# Patient Record
Sex: Male | Born: 1960 | Race: White | Hispanic: No | Smoking: Current every day smoker
Health system: Southern US, Community
[De-identification: ages and names within clinical notes are randomized; demographics above are authoritative.]

## PROBLEM LIST (undated history)

## (undated) DIAGNOSIS — S43006A Unspecified dislocation of unspecified shoulder joint, initial encounter: Secondary | ICD-10-CM

## (undated) DIAGNOSIS — F419 Anxiety disorder, unspecified: Secondary | ICD-10-CM

## (undated) DIAGNOSIS — K469 Unspecified abdominal hernia without obstruction or gangrene: Secondary | ICD-10-CM

## (undated) DIAGNOSIS — S0291XA Unspecified fracture of skull, initial encounter for closed fracture: Secondary | ICD-10-CM

## (undated) HISTORY — PX: BACK SURGERY: SHX140

---

## 2018-04-18 ENCOUNTER — Emergency Department (HOSPITAL_COMMUNITY)
Admission: EM | Admit: 2018-04-18 | Discharge: 2018-04-19 | Disposition: A | Discharge status: Home / Self Care | Payer: Self-pay | Attending: Emergency Medicine | Admitting: Emergency Medicine

## 2018-04-18 ENCOUNTER — Emergency Department (HOSPITAL_COMMUNITY): Payer: Self-pay

## 2018-04-18 ENCOUNTER — Encounter (HOSPITAL_COMMUNITY): Payer: Self-pay | Admitting: Emergency Medicine

## 2018-04-18 DIAGNOSIS — K5792 Diverticulitis of intestine, part unspecified, without perforation or abscess without bleeding: Secondary | ICD-10-CM | POA: Insufficient documentation

## 2018-04-18 DIAGNOSIS — Z79899 Other long term (current) drug therapy: Secondary | ICD-10-CM | POA: Insufficient documentation

## 2018-04-18 DIAGNOSIS — F17200 Nicotine dependence, unspecified, uncomplicated: Secondary | ICD-10-CM | POA: Insufficient documentation

## 2018-04-18 HISTORY — DX: Unspecified abdominal hernia without obstruction or gangrene: K46.9

## 2018-04-18 HISTORY — DX: Unspecified fracture of skull, initial encounter for closed fracture: S02.91XA

## 2018-04-18 HISTORY — DX: Unspecified dislocation of unspecified shoulder joint, initial encounter: S43.006A

## 2018-04-18 HISTORY — DX: Anxiety disorder, unspecified: F41.9

## 2018-04-18 LAB — COMPREHENSIVE METABOLIC PANEL
ALT: 36 U/L (ref 0–44)
ANION GAP: 10 (ref 5–15)
AST: 21 U/L (ref 15–41)
Albumin: 3.7 g/dL (ref 3.5–5.0)
Alkaline Phosphatase: 62 U/L (ref 38–126)
BUN: 10 mg/dL (ref 6–20)
CHLORIDE: 106 mmol/L (ref 98–111)
CO2: 23 mmol/L (ref 22–32)
CREATININE: 1.08 mg/dL (ref 0.61–1.24)
Calcium: 9.4 mg/dL (ref 8.9–10.3)
Glucose, Bld: 87 mg/dL (ref 70–99)
POTASSIUM: 4.7 mmol/L (ref 3.5–5.1)
SODIUM: 139 mmol/L (ref 135–145)
Total Bilirubin: 0.8 mg/dL (ref 0.3–1.2)
Total Protein: 7.1 g/dL (ref 6.5–8.1)

## 2018-04-18 LAB — ABO/RH: ABO/RH(D): A POS

## 2018-04-18 LAB — URINALYSIS, ROUTINE W REFLEX MICROSCOPIC
BILIRUBIN URINE: NEGATIVE
Glucose, UA: NEGATIVE mg/dL
HGB URINE DIPSTICK: NEGATIVE
Ketones, ur: NEGATIVE mg/dL
Leukocytes, UA: NEGATIVE
NITRITE: NEGATIVE
PROTEIN: NEGATIVE mg/dL
Specific Gravity, Urine: 1.027 (ref 1.005–1.030)
pH: 5 (ref 5.0–8.0)

## 2018-04-18 LAB — CBC
HCT: 49.7 % (ref 39.0–52.0)
HEMOGLOBIN: 16.2 g/dL (ref 13.0–17.0)
MCH: 31.2 pg (ref 26.0–34.0)
MCHC: 32.6 g/dL (ref 30.0–36.0)
MCV: 95.8 fL (ref 78.0–100.0)
PLATELETS: 271 10*3/uL (ref 150–400)
RBC: 5.19 MIL/uL (ref 4.22–5.81)
RDW: 13.4 % (ref 11.5–15.5)
WBC: 12 10*3/uL — AB (ref 4.0–10.5)

## 2018-04-18 LAB — TYPE AND SCREEN
ABO/RH(D): A POS
Antibody Screen: NEGATIVE

## 2018-04-18 MED ORDER — OXYCODONE-ACETAMINOPHEN 5-325 MG PO TABS
1.0000 | ORAL_TABLET | Freq: Once | ORAL | Status: AC
  Administered 2018-04-18: 1 via ORAL
  Filled 2018-04-18: qty 1

## 2018-04-18 NOTE — ED Triage Notes (Signed)
Per EMS:  Pt presents to ED for assessment of lower groin pain x 6 years, with hx of bilateral groin hernias.  On Tuesday he felt a sudden sharp pain while driving down the road, and starting Wednesday pt began to note blood when wiping.  Pt also c/o pain when urinating.  Patient in and out of tears in triage.  Pt c/o nausea and "feeling drunk" from the pain when it is intense.  VSS

## 2018-04-18 NOTE — ED Provider Notes (Signed)
MSE was initiated and I personally evaluated the patient and placed orders (if any) at  8:06 PM on April 18, 2018.  The patient appears stable so that the remainder of the MSE may be completed by another provider.  Patient placed in Quick Look pathway, seen and evaluated   Chief Complaint: Groin pain  HPI:   Patient presents to ED for multiple complaints.  He complains of R > L sided groin pain.  Symptoms worsened 3 days ago while he was driving.  2 days ago, he began experiencing bright red blood with wiping after his bowel movements.  Also reports blood mixed in with bowel movements.  Also reports pain with urination.  He had been told that he had bilateral groin hernias for which he has never needed surgical evaluation for.  ROS: Groin pain  Physical Exam:   Gen: No distress  Neuro: Awake and Alert  Skin: Warm    Focused Exam: TTP and edema or the suprapubic area. Patient declines exam for inguinal hernias 2/2 pain. Given pain medication.   Initiation of care has begun. The patient has been counseled on the process, plan, and necessity for staying for the completion/evaluation, and the remainder of the medical screening examination    Dietrich PatesKhatri, Mariaguadalupe Fialkowski, PA-C 04/18/18 2016    Tegeler, Canary Brimhristopher J, MD 04/19/18 [phone]

## 2018-04-19 MED ORDER — TRAMADOL HCL 50 MG PO TABS: 50.0000 mg | ORAL_TABLET | Freq: Four times a day (QID) | ORAL | 0 refills | Status: DC | PRN

## 2018-04-19 MED ORDER — SODIUM CHLORIDE 0.9 % IV BOLUS
500.0000 mL | Freq: Once | INTRAVENOUS | Status: AC
  Administered 2018-04-19: 500 mL via INTRAVENOUS

## 2018-04-19 MED ORDER — METRONIDAZOLE IN NACL 5-0.79 MG/ML-% IV SOLN
500.0000 mg | Freq: Once | INTRAVENOUS | Status: AC
  Administered 2018-04-19: 500 mg via INTRAVENOUS
  Filled 2018-04-19: qty 100

## 2018-04-19 MED ORDER — MORPHINE SULFATE (PF) 4 MG/ML IV SOLN
4.0000 mg | Freq: Once | INTRAVENOUS | Status: AC
  Administered 2018-04-19: 4 mg via INTRAVENOUS
  Filled 2018-04-19: qty 1

## 2018-04-19 MED ORDER — METRONIDAZOLE 500 MG PO TABS: 500.0000 mg | ORAL_TABLET | Freq: Three times a day (TID) | ORAL | 0 refills | Status: DC

## 2018-04-19 MED ORDER — CIPROFLOXACIN HCL 500 MG PO TABS: 500.0000 mg | ORAL_TABLET | Freq: Two times a day (BID) | ORAL | 0 refills | Status: DC

## 2018-04-19 MED ORDER — CIPROFLOXACIN IN D5W 400 MG/200ML IV SOLN
400.0000 mg | Freq: Once | INTRAVENOUS | Status: AC
Start: 2018-04-19 — End: 2018-04-19
  Administered 2018-04-19: 400 mg via INTRAVENOUS
  Filled 2018-04-19: qty 200

## 2018-04-19 MED ORDER — ONDANSETRON HCL 4 MG/2ML IJ SOLN
4.0000 mg | Freq: Once | INTRAMUSCULAR | Status: AC
  Administered 2018-04-19: 4 mg via INTRAVENOUS
  Filled 2018-04-19: qty 2

## 2018-04-19 NOTE — ED Provider Notes (Signed)
[facility] EMERGENCY DEPARTMENT Provider Note   CSN: [id number] Arrival date & time: 04/18/18  1945     History   Chief Complaint Chief Complaint  Patient presents with  . Rectal Bleeding  . Dysuria    HPI Evan Barker is a 57 y.o. male.  Patient presents to the emergency department with abdominal pain.  Patient reports that he has a history of inguinal hernias that have been hurting for many years.  Usually he has pain on the right side.  For the last several days, however, he has had left sided abdominal pain down into his groin.  He has felt constipated, has used magnesium citrate.  He has had bowel movements but it has not relieved the pain and pressure.  He has been straining, trying to have a bowel movement.  He has noticed some blood on the tissue when he wipes.     Past Medical History:  Diagnosis Date  . Anxiety   . Fracture, skull (HCC)   . Hernia of abdominal cavity   . Shoulder dislocation     There are no active problems to display for this patient.   Past Surgical History:  Procedure Laterality Date  . BACK SURGERY          Home Medications    Prior to Admission medications   Medication Sig Start Date End Date Taking? Authorizing Provider  acetaminophen (TYLENOL) 500 MG tablet Take 500-1,000 mg by mouth every 6 (six) hours as needed for mild pain, moderate pain, fever or headache.   Yes [provider]  ibuprofen (ADVIL,MOTRIN) 200 MG tablet Take 200-600 mg by mouth every 6 (six) hours as needed for fever, headache, mild pain, moderate pain or cramping.   Yes [provider]  omeprazole (PRILOSEC) 20 MG capsule Take 20 mg by mouth at bedtime.   Yes [provider]  traZODone (DESYREL) 50 MG tablet Take 150 mg by mouth at bedtime.   Yes [provider]    Family History History reviewed. No pertinent family history.  Social History Social History   Tobacco Use  . Smoking status: Current  Every Day Smoker    Packs/day: 1.00  . Smokeless tobacco: Never Used  Substance Use Topics  . Alcohol use: Not Currently  . Drug use: Never     Allergies   Aspirin and Penicillins   Review of Systems Review of Systems  Gastrointestinal: Positive for abdominal pain, anal bleeding and constipation.  All other systems reviewed and are negative.    Physical Exam Updated Vital Signs BP (!) 155/108 (BP Location: Left Arm)   Pulse (!) 101   Temp 98 F (36.7 C) (Oral)   Resp 18   SpO2 96%   Physical Exam  Constitutional: He is oriented to person, place, and time. He appears well-developed and well-nourished. No distress.  HENT:  Head: Normocephalic and atraumatic.  Right Ear: Hearing normal.  Left Ear: Hearing normal.  Nose: Nose normal.  Mouth/Throat: Oropharynx is clear and moist and mucous membranes are normal.  Eyes: Pupils are equal, round, and reactive to light. Conjunctivae and EOM are normal.  Neck: Normal range of motion. Neck supple.  Cardiovascular: Regular rhythm, S1 normal and S2 normal. Exam reveals no gallop and no friction rub.  No murmur heard. Pulmonary/Chest: Effort normal and breath sounds normal. No respiratory distress. He exhibits no tenderness.  Abdominal: Soft. Normal appearance and bowel sounds are normal. There is no hepatosplenomegaly. There is tenderness in  the left lower quadrant. There is no rebound, no guarding, no tenderness at McBurney's point and negative Murphy's sign. No hernia.  Musculoskeletal: Normal range of motion.  Neurological: He is alert and oriented to person, place, and time. He has normal strength. No cranial nerve deficit or sensory deficit. Coordination normal. GCS eye subscore is 4. GCS verbal subscore is 5. GCS motor subscore is 6.  Skin: Skin is warm, dry and intact. No rash noted. No cyanosis.  Psychiatric: He has a normal mood and affect. His speech is normal and behavior is normal. Thought content normal.  Nursing note  and vitals reviewed.    ED Treatments / Results  Labs (all labs ordered are listed, but only abnormal results are displayed) Labs Reviewed  CBC - Abnormal; Notable for the following components:      Result Value   WBC 12.0 (*)    All other components within normal limits  COMPREHENSIVE METABOLIC PANEL  URINALYSIS, ROUTINE W REFLEX MICROSCOPIC  POC OCCULT BLOOD, ED  TYPE AND SCREEN  ABO/RH    EKG None  Radiology Ct Renal Stone Study  Result Date: 04/18/2018 CLINICAL DATA:  Abdominal pain for several days EXAM: CT ABDOMEN AND PELVIS WITHOUT CONTRAST TECHNIQUE: Multidetector CT imaging of the abdomen and pelvis was performed following the standard protocol without oral intravenous contrast. COMPARISON:  None. FINDINGS: Lower chest: Lung bases are clear. There are foci of coronary artery calcification. Hepatobiliary: No focal liver lesions are evident on this noncontrast enhanced study. Gallbladder wall is not appreciably thickened. There is no biliary duct dilatation. Pancreas: No pancreatic mass or inflammatory focus. Spleen: No splenic lesions are evident. Adrenals/Urinary Tract: Adrenals bilaterally appear normal. Kidneys bilaterally show no evident mass or hydronephrosis on either side. There is no appreciable renal or ureteral calculus on either side. Urinary bladder is midline with wall thickness within normal limits. Stomach/Bowel: There is wall thickening in the sigmoid colon with surrounding mesenteric stranding consistent with a degree of diverticulitis. No abscess or perforation evident in the sigmoid region. Elsewhere, there is no bowel wall or mesenteric thickening. No evident bowel obstruction. No free air or portal venous air. Vascular/Lymphatic: There is extensive aortic atherosclerosis. There is also atherosclerotic calcification in the common and internal iliac arteries. Mild calcification is noted in the common femoral arteries. No evident aneurysm. Major mesenteric arterial  vessels appear patent on this noncontrast enhanced study. There is no evident adenopathy in the abdomen or pelvis. Reproductive: There are multiple prostatic calculi. Prostate and seminal vesicles appear normal in size and contour. There is no evident pelvic mass. Other: Appendix appears unremarkable. There is no abscess or ascites in the abdomen or pelvis. There is a small ventral hernia containing only fat. There is fat in each inguinal ring. Musculoskeletal: The patient has had a previous hemilaminotomy on the right at L4-5. There are no blastic or lytic bone lesions. There is no intramuscular lesion. IMPRESSION: 1.  Sigmoid diverticulitis.  No abscess or perforation evident. 2. No renal or ureteral calculi. No hydronephrosis. There are multiple prostatic calculi evident. 3. No bowel obstruction. Appendix appears normal. No abscess in the abdomen or pelvis. 4. Aortoiliac atherosclerosis. There are foci of coronary artery calcification. 5. Small ventral hernia containing only fat. Fat in each inguinal ring. Aortic Atherosclerosis (ICD10-I70.0). Electronically Signed   By: Bretta Bang III M.D.   On: 04/18/2018 20:32    Procedures Procedures (including critical care time)  Medications Ordered in ED Medications  sodium chloride 0.9 % bolus  500 mL (has no administration in time range)  morphine 4 MG/ML injection 4 mg (has no administration in time range)  ondansetron (ZOFRAN) injection 4 mg (has no administration in time range)  ciprofloxacin (CIPRO) IVPB 400 mg (has no administration in time range)  metroNIDAZOLE (FLAGYL) IVPB 500 mg (has no administration in time range)  oxyCODONE-acetaminophen (PERCOCET/ROXICET) 5-325 MG per tablet 1 tablet (1 tablet Oral Given 04/18/18 1956)     Initial Impression / Assessment and Plan / ED Course  I have reviewed the triage vital signs and the nursing notes.  Pertinent labs & imaging results that were available during my care of the patient were reviewed  by me and considered in my medical decision making (see chart for details).     Patient presents to the emergency department for evaluation of left lower abdominal pain radiating to the groin.  He is concerned about the possibility of hernias.  He does have left lower quadrant tenderness.  Blood work was unremarkable, urine is clear, no concern for infection.  CT scan shows sigmoid diverticulitis, uncomplicated.  Patient treated with Cipro Flagyl and analgesia.  Will be discharged with oral course of antibiotics, follow-up with gastroenterology.  Final Clinical Impressions(s) / ED Diagnoses   Final diagnoses:  Diverticulitis    ED Discharge Orders    None       Pollina, Canary Brimhristopher J, MD 04/19/18 [phone]

## 2018-11-10 ENCOUNTER — Other Ambulatory Visit: Payer: Self-pay

## 2018-11-10 ENCOUNTER — Emergency Department (HOSPITAL_COMMUNITY): Payer: Self-pay

## 2018-11-10 ENCOUNTER — Encounter (HOSPITAL_COMMUNITY): Payer: Self-pay | Admitting: Emergency Medicine

## 2018-11-10 ENCOUNTER — Observation Stay (HOSPITAL_COMMUNITY)
Admission: EM | Admit: 2018-11-10 | Discharge: 2018-11-11 | Disposition: A | Discharge status: Home / Self Care | Payer: Self-pay | Attending: Oncology | Admitting: Oncology

## 2018-11-10 ENCOUNTER — Observation Stay (HOSPITAL_COMMUNITY): Payer: Self-pay

## 2018-11-10 DIAGNOSIS — I4510 Unspecified right bundle-branch block: Secondary | ICD-10-CM

## 2018-11-10 DIAGNOSIS — F17210 Nicotine dependence, cigarettes, uncomplicated: Secondary | ICD-10-CM | POA: Insufficient documentation

## 2018-11-10 DIAGNOSIS — R202 Paresthesia of skin: Secondary | ICD-10-CM

## 2018-11-10 DIAGNOSIS — Z880 Allergy status to penicillin: Secondary | ICD-10-CM | POA: Insufficient documentation

## 2018-11-10 DIAGNOSIS — R29810 Facial weakness: Secondary | ICD-10-CM | POA: Insufficient documentation

## 2018-11-10 DIAGNOSIS — F419 Anxiety disorder, unspecified: Secondary | ICD-10-CM | POA: Insufficient documentation

## 2018-11-10 DIAGNOSIS — K219 Gastro-esophageal reflux disease without esophagitis: Secondary | ICD-10-CM | POA: Insufficient documentation

## 2018-11-10 DIAGNOSIS — Z79899 Other long term (current) drug therapy: Secondary | ICD-10-CM | POA: Insufficient documentation

## 2018-11-10 DIAGNOSIS — R079 Chest pain, unspecified: Principal | ICD-10-CM | POA: Diagnosis present

## 2018-11-10 DIAGNOSIS — R0789 Other chest pain: Secondary | ICD-10-CM

## 2018-11-10 DIAGNOSIS — R7303 Prediabetes: Secondary | ICD-10-CM | POA: Insufficient documentation

## 2018-11-10 DIAGNOSIS — Z886 Allergy status to analgesic agent status: Secondary | ICD-10-CM | POA: Insufficient documentation

## 2018-11-10 DIAGNOSIS — Z791 Long term (current) use of non-steroidal anti-inflammatories (NSAID): Secondary | ICD-10-CM | POA: Insufficient documentation

## 2018-11-10 DIAGNOSIS — R072 Precordial pain: Secondary | ICD-10-CM

## 2018-11-10 DIAGNOSIS — R531 Weakness: Secondary | ICD-10-CM | POA: Insufficient documentation

## 2018-11-10 DIAGNOSIS — G4700 Insomnia, unspecified: Secondary | ICD-10-CM | POA: Insufficient documentation

## 2018-11-10 LAB — COMPREHENSIVE METABOLIC PANEL
ALT: 32 U/L (ref 0–44)
AST: 22 U/L (ref 15–41)
Albumin: 3.7 g/dL (ref 3.5–5.0)
Alkaline Phosphatase: 45 U/L (ref 38–126)
Anion gap: 7 (ref 5–15)
BUN: 14 mg/dL (ref 6–20)
CALCIUM: 9.3 mg/dL (ref 8.9–10.3)
CO2: 22 mmol/L (ref 22–32)
CREATININE: 1.05 mg/dL (ref 0.61–1.24)
Chloride: 109 mmol/L (ref 98–111)
GFR calc non Af Amer: >60 mL/min (ref >60)
Glucose, Bld: 111 mg/dL — ABNORMAL HIGH (ref 70–99)
Potassium: 4.1 mmol/L (ref 3.5–5.1)
Sodium: 138 mmol/L (ref 135–145)
Total Bilirubin: 0.7 mg/dL (ref 0.3–1.2)
Total Protein: 6.8 g/dL (ref 6.5–8.1)

## 2018-11-10 LAB — CBC WITH DIFFERENTIAL/PLATELET
Abs Immature Granulocytes: 0.03 10*3/uL (ref 0.00–0.07)
BASOS PCT: 1 %
Basophils Absolute: 0.1 10*3/uL (ref 0.0–0.1)
EOS ABS: 0.2 10*3/uL (ref 0.0–0.5)
Eosinophils Relative: 2 %
HCT: 45.6 % (ref 39.0–52.0)
Hemoglobin: 15.9 g/dL (ref 13.0–17.0)
Immature Granulocytes: 0 %
Lymphocytes Relative: 30 %
Lymphs Abs: 2.9 10*3/uL (ref 0.7–4.0)
MCH: 31.5 pg (ref 26.0–34.0)
MCHC: 34.9 g/dL (ref 30.0–36.0)
MCV: 90.3 fL (ref 80.0–100.0)
MONO ABS: 0.5 10*3/uL (ref 0.1–1.0)
MONOS PCT: 6 %
Neutro Abs: 6.1 10*3/uL (ref 1.7–7.7)
Neutrophils Relative %: 61 %
PLATELETS: 253 10*3/uL (ref 150–400)
RBC: 5.05 MIL/uL (ref 4.22–5.81)
RDW: 12.7 % (ref 11.5–15.5)
WBC: 9.8 10*3/uL (ref 4.0–10.5)
nRBC: 0 % (ref 0.0–0.2)

## 2018-11-10 LAB — CREATININE, SERUM
Creatinine, Ser: 1.08 mg/dL (ref 0.61–1.24)
GFR calc Af Amer: >60 mL/min (ref >60)
GFR calc non Af Amer: >60 mL/min (ref >60)

## 2018-11-10 LAB — CBC
HCT: 48.1 % (ref 39.0–52.0)
Hemoglobin: 16.3 g/dL (ref 13.0–17.0)
MCH: 30.8 pg (ref 26.0–34.0)
MCHC: 33.9 g/dL (ref 30.0–36.0)
MCV: 90.9 fL (ref 80.0–100.0)
Platelets: 174 10*3/uL (ref 150–400)
RBC: 5.29 MIL/uL (ref 4.22–5.81)
RDW: 12.5 % (ref 11.5–15.5)
WBC: 10.2 10*3/uL (ref 4.0–10.5)
nRBC: 0 % (ref 0.0–0.2)

## 2018-11-10 LAB — D-DIMER, QUANTITATIVE (NOT AT ARMC): D DIMER QUANT: 0.33 ug{FEU}/mL (ref 0.00–0.50)

## 2018-11-10 LAB — CBG MONITORING, ED: Glucose-Capillary: 124 mg/dL — ABNORMAL HIGH (ref 70–99)

## 2018-11-10 LAB — LIPID PANEL
CHOL/HDL RATIO: 6.7 ratio
Cholesterol: 228 mg/dL — ABNORMAL HIGH (ref 0–200)
HDL: 34 mg/dL — ABNORMAL LOW (ref >40)
LDL CALC: 121 mg/dL — AB (ref 0–99)
Triglycerides: 365 mg/dL — ABNORMAL HIGH (ref <150)
VLDL: 73 mg/dL — ABNORMAL HIGH (ref 0–40)

## 2018-11-10 LAB — HEMOGLOBIN A1C
Hgb A1c MFr Bld: 5.8 % — ABNORMAL HIGH (ref 4.8–5.6)
Mean Plasma Glucose: 119.76 mg/dL

## 2018-11-10 LAB — BRAIN NATRIURETIC PEPTIDE: B NATRIURETIC PEPTIDE 5: 22.7 pg/mL (ref 0.0–100.0)

## 2018-11-10 LAB — TROPONIN I: Troponin I: <0.03 ng/mL (ref <0.03)

## 2018-11-10 MED ORDER — NICOTINE 21 MG/24HR TD PT24
21.0000 mg | MEDICATED_PATCH | Freq: Every day | TRANSDERMAL | Status: DC
  Administered 2018-11-11 (×2): 21 mg via TRANSDERMAL
  Filled 2018-11-10 (×2): qty 1

## 2018-11-10 MED ORDER — SENNOSIDES-DOCUSATE SODIUM 8.6-50 MG PO TABS: 1.0000 | ORAL_TABLET | Freq: Every evening | ORAL | Status: DC | PRN

## 2018-11-10 MED ORDER — SODIUM CHLORIDE 0.9 % IV SOLN: 250.0000 mL | INTRAVENOUS | Status: DC | PRN

## 2018-11-10 MED ORDER — ONDANSETRON HCL 4 MG PO TABS: 4.0000 mg | ORAL_TABLET | Freq: Four times a day (QID) | ORAL | Status: DC | PRN

## 2018-11-10 MED ORDER — ONDANSETRON HCL 4 MG/2ML IJ SOLN: 4.0000 mg | Freq: Four times a day (QID) | INTRAMUSCULAR | Status: DC | PRN

## 2018-11-10 MED ORDER — NITROGLYCERIN 0.4 MG SL SUBL
0.4000 mg | SUBLINGUAL_TABLET | SUBLINGUAL | Status: DC | PRN
  Administered 2018-11-11 (×3): 0.4 mg via SUBLINGUAL
  Filled 2018-11-10 (×2): qty 1

## 2018-11-10 MED ORDER — TRAZODONE HCL 50 MG PO TABS
150.0000 mg | ORAL_TABLET | Freq: Every day | ORAL | Status: DC
  Administered 2018-11-11: 150 mg via ORAL
  Filled 2018-11-10: qty 3

## 2018-11-10 MED ORDER — SODIUM CHLORIDE 0.9% FLUSH: 3.0000 mL | INTRAVENOUS | Status: DC | PRN

## 2018-11-10 MED ORDER — HEPARIN SODIUM (PORCINE) 5000 UNIT/ML IJ SOLN
5000.0000 [IU] | Freq: Three times a day (TID) | INTRAMUSCULAR | Status: DC
  Administered 2018-11-11 (×3): 5000 [IU] via SUBCUTANEOUS
  Filled 2018-11-10 (×3): qty 1

## 2018-11-10 MED ORDER — ACETAMINOPHEN 325 MG PO TABS
650.0000 mg | ORAL_TABLET | Freq: Four times a day (QID) | ORAL | Status: DC | PRN
  Administered 2018-11-10 – 2018-11-11 (×2): 650 mg via ORAL
  Filled 2018-11-10 (×2): qty 2

## 2018-11-10 MED ORDER — SODIUM CHLORIDE 0.9% FLUSH
3.0000 mL | Freq: Two times a day (BID) | INTRAVENOUS | Status: DC
  Administered 2018-11-11: 3 mL via INTRAVENOUS

## 2018-11-10 MED ORDER — ACETAMINOPHEN 650 MG RE SUPP: 650.0000 mg | Freq: Four times a day (QID) | RECTAL | Status: DC | PRN

## 2018-11-10 MED ORDER — PANTOPRAZOLE SODIUM 40 MG PO TBEC
40.0000 mg | DELAYED_RELEASE_TABLET | Freq: Every day | ORAL | Status: DC
  Administered 2018-11-10 – 2018-11-11 (×2): 40 mg via ORAL
  Filled 2018-11-10 (×2): qty 1

## 2018-11-10 NOTE — H&P (Addendum)
Date: 11/10/2018               Patient Name:  Evan Barker MRN: [id number]  DOB: August 12, 1961 Age / Sex: 58 y.o., male   PCP: System, Pcp Not In         Medical Service: Internal Medicine Teaching Service         Attending Physician: Dr. Cyndie Chime, Genene Churn, MD    First Contact: Dr. Petra Kuba Pager: [phone]  Second Contact: Dr. Alinda Money Pager: [phone]       After Hours (After 5p/  First Contact Pager: [phone]  weekends / holidays): Second Contact Pager: [phone]   Chief Complaint: chest pain  History of Present Illness: 57yoM with no diagnosed PMH who presents with chest pain and sob. He endorses 3 episodes since December, all occurring at rest. He describes acute onset pain between his shoulder blades with associated diaphoresis, sob, substernal to left sided chest pain (pressure and stabbing), headache, arm numbness. Episodes last 30 minutes and self resolve. However, this episode was more severe and longer duration. He denies nausea, vomiting or abdominal pain. He reports a childhood allergy to aspirin (hives) and was not given any medications by EMS.   He last saw a doctor when he was admitted 8 months ago for diverticulitis and does not have a PCP.   ROS is also positive for polyuria and polydipsia and a few months of intermittent dysarthria and balance issues (denies room spinning or syncope) along with peripheral neuropathy. He also endorses orthopnea with productive cough when he lays flat as well as dyspnea on exertion.    Meds:  Pantoprazole qd Trazadone qhs Ibuprofen prn    Allergies: Allergies as of 11/10/2018 - Review Complete 11/10/2018  Allergen Reaction Noted  . Aspirin Rash 04/18/2018  . Penicillins Rash 04/18/2018   Past Medical History:  Diagnosis Date  . Anxiety   . Fracture, skull (HCC)   . Hernia of abdominal cavity   . Shoulder dislocation     Family History: Several uncles with heart attack before age 39. Daughter with some kind of heart murmur  (unsure what her formal diagnosis is)  Social History: Smokes 1-2 packs per day since age 65, occasional alcohol use. Denies current or previous regular illicit drug use. 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 Has a girlfriend, children, and a grandson born in Nov.   Review of Systems: A complete ROS was negative except as per HPI.   Physical Exam: Blood pressure (!) 133/94, pulse 82, temperature 98.7 F (37.1 C), temperature source Oral, resp. rate 16, SpO2 99 %. Gen: well appearing but anxious, NAD, well nourished  HENT: PERRL, EOMI, oropharynx clear without exudate  Cardiac: no JVD, RRR, no m/r/g, left chest wall is TTP Pulm: CTAB, breathing comfortably  Abd: soft, NT, ND, reducible nonpainful umbilical and inguinal hernias Ext: no LEE, calf swelling or tenderness. Bilateral feet are cool, right colder than left. DP pulses and strong and symmetric bilaterally. No ischemic skin changes  Neuro: A&O x 3. EOMI, PERRL. Slight right sided facial droop, uvula midline,  Facial sensation is symmetric. Sensation is decreased in right arm and right leg with decreased (4+/5) strength in right arm, right grip, and leg and absent right patellar reflex. Left patellar reflex is present. Difficulty with finger to nose testing bilaterally  Psych: anxious appearing,  intermittently tearful, cognition intact   Labs: CBC and CMP unremarkable  Trop < 0.03 D-dimer 0.33 BNP 22.7   EKG: personally reviewed my interpretation is NSR HR 89 with RBBB and LAFB, no priors for comparison. No q waves or t wave inversions or st changes  CXR: personally reviewed my interpretation is no acute cardiopulmonary disease. Normal cardiac silhouette.  Assessment & Plan by Problem: Active Problems:   Chest pain, rule out acute myocardial infarction  57yoM with no routine medical care, at least a  55 pack year smoking history presenting with typical anginal chest pain occurring at rest.   Chest Pain: Acute onset pain between his shoulder blades with associated diaphoresis, sob, substernal to left sided chest pain (pressure and stabbing), headache, arm numbness. Episodes previously lasting 30 minutes and self resolve but current chest pain remains, albeit slightly improved. Also endorses orthopnea and doe, does not appear volume overloaded on exam. Still concern for possible CHF.  Initial trop negative. EKG with RBBB and LAFB without prior for comparison.  - risk factor modification: a1c (endorses polyuria and polydipsia), lipid panel - trend troponin - echo  - telemetry  - repeat ekg in the morning - SL nitro prn for chest pain, will give one dose now  - k pad for chest wall tenderness - npo at mn for possible cardiac intervention if troponins are elevated   Right sided weakness: endorses intermittent ataxia due to balance issues, dysarthria and on exam has mildly decreased strength in the right with right facial droop and decreased sensation in the right leg with absent patellar reflex. Concerning for stroke but symptoms have been ongoing for several months. Also question anxiety confounding neuro findings.  - head CT  - risk factor modification as above   Insomnia: continue home trazodone qhs GERD: continue home pantoprazole Tobacco abuse: 1-2 packs per day, will order nicotine patch   DVT ppx: heparin  FEN/GI: replete lytes prn, no IVFs, npo @ [facility] for possible cardiac intervention Code: Full   Dispo: Admit patient to Observation with expected length of stay less than 2 midnights.  Signed: Ali Lowe, MD 11/10/2018, 6:59 PM  Pager: [phone]

## 2018-11-10 NOTE — ED Notes (Signed)
Ordered dinner tray.  

## 2018-11-10 NOTE — ED Provider Notes (Signed)
Emergency Department Provider Note   I have reviewed the triage vital signs and the nursing notes.   HISTORY  Chief Complaint No chief complaint on file.   HPI Evan Barker is a 58 y.o. male with PMH of tobacco use but no PMD presents to the ED with pain between the shoulder blades with SOB and chest pain. Symptoms began suddenly today. Patient has had two prior episodes of pain and symptoms like this since September but has not been to the hospital for this in the past. Today's symptoms are worse than prior. EMS was called by his girlfriend. He was not given ASA noting an allergy as a child but is unsure regarding the nature of the allergy. He also reports generalized weakness and HA. No fever or chills. During the chest pain episode he did have tingling in the bilateral hands but no numbness. No lower extremity symptoms.   Past Medical History:  Diagnosis Date  . Anxiety   . Fracture, skull (HCC)   . Hernia of abdominal cavity   . Shoulder dislocation     There are no active problems to display for this patient.   Past Surgical History:  Procedure Laterality Date  . BACK SURGERY     Allergies Aspirin and Penicillins  Family History  Problem Relation Age of Onset  . Heart disease Maternal Uncle   . Heart disease Paternal Uncle   . Heart murmur Daughter     Social History Social History   Tobacco Use  . Smoking status: Current Every Day Smoker    Packs/day: 1.00  . Smokeless tobacco: Never Used  Substance Use Topics  . Alcohol use: Not Currently  . Drug use: Never    Review of Systems  Constitutional: No fever/chills. Positive generalized weakness.  Eyes: No visual changes. ENT: No sore throat. Cardiovascular: Positive chest pain. Respiratory: Positive shortness of breath. Gastrointestinal: No abdominal pain.  No nausea, no vomiting.  No diarrhea.  No constipation. Genitourinary: Negative for dysuria. Musculoskeletal: Negative for back pain. Skin:  Negative for rash. Neurological: Negative for focal weakness or numbness. Positive HA. Transient tingling in the fingers bilaterally.   10-point ROS otherwise negative.  ____________________________________________   PHYSICAL EXAM:  VITAL SIGNS: ED Triage Vitals [11/10/18 1505]  Enc Vitals Group     BP (!) 136/98     Pulse Rate 91     Resp 16     Temp 98.7 F (37.1 C)     Temp Source Oral     SpO2 96 %   Constitutional: Alert and oriented. No acute distress.  Eyes: Conjunctivae are normal.  Head: Atraumatic. Nose: No congestion/rhinnorhea. Mouth/Throat: Mucous membranes are moist.  Neck: No stridor.  Cardiovascular: Normal rate, regular rhythm. Good peripheral circulation. Grossly normal heart sounds.   Respiratory: Normal respiratory effort.  No retractions. Lungs CTAB. Gastrointestinal: Soft and nontender. No distention.  Musculoskeletal: No lower extremity tenderness nor edema. No gross deformities of extremities. Neurologic:  Normal speech and language. No gross focal neurologic deficits are appreciated. Decreased strength bilaterally. Normal CN exam 2-12.  Skin:  Skin is warm, dry and intact. No rash noted. Psychiatric: Mood and affect are slightly bizarre and muted. Not responding to internal stimuli.   ____________________________________________   LABS (all labs ordered are listed, but only abnormal results are displayed)  Labs Reviewed  COMPREHENSIVE METABOLIC PANEL - Abnormal; Notable for the following components:      Result Value   Glucose, Bld 111 (*)  All other components within normal limits  CBG MONITORING, ED - Abnormal; Notable for the following components:   Glucose-Capillary 124 (*)    All other components within normal limits  TROPONIN I  BRAIN NATRIURETIC PEPTIDE  CBC WITH DIFFERENTIAL/PLATELET  D-DIMER, QUANTITATIVE (NOT AT [facility])   ____________________________________________  EKG   EKG Interpretation  Date/Time:  Monday November 10 2018 15:04:26 EST Ventricular Rate:  89 PR Interval:    QRS Duration: 136 QT Interval:  394 QTC Calculation: 480 R Axis:   -42 Text Interpretation:  Sinus rhythm RBBB and LAFB No prior ECG for comparison.  No STEMI Confirmed by Theda Belfastegeler, Chris ([id number]) on 11/10/2018 3:07:30 PM       ____________________________________________  RADIOLOGY  Dg Chest 2 View  Result Date: 11/10/2018 CLINICAL DATA:  Chest pain. EXAM: CHEST - 2 VIEW COMPARISON:  None. FINDINGS: The heart size and mediastinal contours are within normal limits. Both lungs are clear. The visualized skeletal structures are unremarkable. IMPRESSION: No active cardiopulmonary disease. Electronically Signed   By: Gerome Samavid  Williams III M.D   On: 11/10/2018 15:58    ____________________________________________   PROCEDURES  Procedure(s) performed:   Procedures  None ____________________________________________   INITIAL IMPRESSION / ASSESSMENT AND PLAN / ED COURSE  Pertinent labs & imaging results that were available during my care of the patient were reviewed by me and considered in my medical decision making (see chart for details).  Patient with pain between the shoulder blades, CP, and SOB with generalized weakness. Somewhat bizarre affect and tearful at times. No focal neuro deficits. Afebrile here. Patient with active smoking history and age which increase risk of ACS. Unknown additional history as the patient does not have a PCP. BP elevated here but unknown chronicity. Plan for labs including troponin, d-dimer, and reassess. Patient reports allergy to ASA so not given. Currently chest-pain free.   Labs and CXR reviewed. Patient moderate risk by HEART score.   Discussed patient's case with Internal Medicine to request admission. Patient and family (if present) updated with plan. Care transferred to Medicine service.  I reviewed all nursing notes, vitals, pertinent old records, EKGs, labs, imaging (as  available).  ____________________________________________  FINAL CLINICAL IMPRESSION(S) / ED DIAGNOSES  Final diagnoses:  Precordial chest pain    Note:  This document was prepared using Dragon voice recognition software and may include unintentional dictation errors.  Alona BeneJoshua Issabella Rix, MD Emergency Medicine    Alexxis Mackert, Arlyss RepressJoshua G, MD 11/10/18 [phone]

## 2018-11-10 NOTE — ED Notes (Signed)
Patient transported to X-ray 

## 2018-11-10 NOTE — ED Notes (Signed)
Pt given sandwich and water.

## 2018-11-10 NOTE — ED Triage Notes (Addendum)
Per EMS: pt from home with c/o CP that begins suddenly and radiates to both arms.  Pt notes 3 episodes since December 2019.  Pt is weak bilaterally in all extremities.  EMS reports pt had an episode of passing out, believed to be related to pt hyperventilating. Patient has stated he does not want anyone present when medical issues are discussed.  18G in left AC.

## 2018-11-10 NOTE — ED Notes (Addendum)
Report given to 2C RN. All questions answered. 

## 2018-11-11 ENCOUNTER — Observation Stay (HOSPITAL_BASED_OUTPATIENT_CLINIC_OR_DEPARTMENT_OTHER): Payer: Self-pay

## 2018-11-11 ENCOUNTER — Other Ambulatory Visit: Payer: Self-pay

## 2018-11-11 DIAGNOSIS — F419 Anxiety disorder, unspecified: Secondary | ICD-10-CM

## 2018-11-11 DIAGNOSIS — F329 Major depressive disorder, single episode, unspecified: Secondary | ICD-10-CM

## 2018-11-11 DIAGNOSIS — R7303 Prediabetes: Secondary | ICD-10-CM

## 2018-11-11 DIAGNOSIS — R072 Precordial pain: Secondary | ICD-10-CM

## 2018-11-11 LAB — TROPONIN I
Troponin I: <0.03 ng/mL (ref <0.03)
Troponin I: <0.03 ng/mL (ref <0.03)

## 2018-11-11 LAB — ECHOCARDIOGRAM COMPLETE
Height: 74 in
Weight: 3559.11 oz

## 2018-11-11 LAB — MRSA PCR SCREENING: MRSA by PCR: NEGATIVE

## 2018-11-11 LAB — HIV ANTIBODY (ROUTINE TESTING W REFLEX): HIV Screen 4th Generation wRfx: NONREACTIVE

## 2018-11-11 MED ORDER — SERTRALINE HCL 25 MG PO TABS: 25.0000 mg | ORAL_TABLET | Freq: Every day | ORAL | 0 refills | Status: AC

## 2018-11-11 MED ORDER — SERTRALINE HCL 25 MG PO TABS: 25.0000 mg | ORAL_TABLET | Freq: Every day | ORAL | Status: DC

## 2018-11-11 MED ORDER — ATORVASTATIN CALCIUM 40 MG PO TABS
40.0000 mg | ORAL_TABLET | Freq: Every day | ORAL | Status: DC
  Administered 2018-11-11: 40 mg via ORAL
  Filled 2018-11-11: qty 1

## 2018-11-11 MED ORDER — ATORVASTATIN CALCIUM 40 MG PO TABS: 40.0000 mg | ORAL_TABLET | Freq: Every day | ORAL | 0 refills | Status: AC

## 2018-11-11 MED FILL — ATORVASTATIN CALCIUM 40 MG: 40 | 30 days supply | Qty: 30 | Fill #0

## 2018-11-11 MED FILL — SERTRALINE HCL 25 MG TABS: 25 | 30 days supply | Qty: 30 | Fill #0

## 2018-11-11 NOTE — Discharge Instructions (Signed)
DASH Eating Plan °DASH stands for "Dietary Approaches to Stop Hypertension." The DASH eating plan is a healthy eating plan that has been shown to reduce high blood pressure (hypertension). It may also reduce your risk for type 2 diabetes, heart disease, and stroke. The DASH eating plan may also help with weight loss. °What are tips for following this plan? ° °General guidelines °· Avoid eating more than 2,300 mg (milligrams) of salt (sodium) a day. If you have hypertension, you may need to reduce your sodium intake to 1,500 mg a day. °· Limit alcohol intake to no more than 1 drink a day for nonpregnant women and 2 drinks a day for men. One drink equals 12 oz of beer, 5 oz of wine, or 1½ oz of hard liquor. °· Work with your health care provider to maintain a healthy body weight or to lose weight. Ask what an ideal weight is for you. °· Get at least 30 minutes of exercise that causes your heart to beat faster (aerobic exercise) most days of the week. Activities may include walking, swimming, or biking. °· Work with your health care provider or diet and nutrition specialist (dietitian) to adjust your eating plan to your individual calorie needs. °Reading food labels ° °· Check food labels for the amount of sodium per serving. Choose foods with less than 5 percent of the Daily Value of sodium. Generally, foods with less than 300 mg of sodium per serving fit into this eating plan. °· To find whole grains, look for the word "whole" as the first word in the ingredient list. °Shopping °· Buy products labeled as "low-sodium" or "no salt added." °· Buy fresh foods. Avoid canned foods and premade or frozen meals. °Cooking °· Avoid adding salt when cooking. Use salt-free seasonings or herbs instead of table salt or sea salt. Check with your health care provider or pharmacist before using salt substitutes. °· Do not fry foods. Cook foods using healthy methods such as baking, boiling, grilling, and broiling instead. °· Cook with  heart-healthy oils, such as olive, canola, soybean, or sunflower oil. °Meal planning °· Eat a balanced diet that includes: °? 5 or more servings of fruits and vegetables each day. At each meal, try to fill half of your plate with fruits and vegetables. °? Up to 6-8 servings of whole grains each day. °? Less than 6 oz of lean meat, poultry, or fish each day. A 3-oz serving of meat is about the same size as a deck of cards. One egg equals 1 oz. °? 2 servings of low-fat dairy each day. °? A serving of nuts, seeds, or beans 5 times each week. °? Heart-healthy fats. Healthy fats called Omega-3 fatty acids are found in foods such as flaxseeds and coldwater fish, like sardines, salmon, and mackerel. °· Limit how much you eat of the following: °? Canned or prepackaged foods. °? Food that is high in trans fat, such as fried foods. °? Food that is high in saturated fat, such as fatty meat. °? Sweets, desserts, sugary drinks, and other foods with added sugar. °? Full-fat dairy products. °· Do not salt foods before eating. °· Try to eat at least 2 vegetarian meals each week. °· Eat more home-cooked food and less restaurant, buffet, and fast food. °· When eating at a restaurant, ask that your food be prepared with less salt or no salt, if possible. °What foods are recommended? °The items listed may not be a complete list. Talk with your dietitian about   what dietary choices are best for you. °Grains °Whole-grain or whole-wheat bread. Whole-grain or whole-wheat pasta. Brown rice. Oatmeal. Quinoa. Bulgur. Whole-grain and low-sodium cereals. Pita bread. Low-fat, low-sodium crackers. Whole-wheat flour tortillas. °Vegetables °Fresh or frozen vegetables (raw, steamed, roasted, or grilled). Low-sodium or reduced-sodium tomato and vegetable juice. Low-sodium or reduced-sodium tomato sauce and tomato paste. Low-sodium or reduced-sodium canned vegetables. °Fruits °All fresh, dried, or frozen fruit. Canned fruit in natural juice (without  added sugar). °Meat and other protein foods °Skinless chicken or turkey. Ground chicken or turkey. Pork with fat trimmed off. Fish and seafood. Egg whites. Dried beans, peas, or lentils. Unsalted nuts, nut butters, and seeds. Unsalted canned beans. Lean cuts of beef with fat trimmed off. Low-sodium, lean deli meat. °Dairy °Low-fat (1%) or fat-free (skim) milk. Fat-free, low-fat, or reduced-fat cheeses. Nonfat, low-sodium ricotta or cottage cheese. Low-fat or nonfat yogurt. Low-fat, low-sodium cheese. °Fats and oils °Soft margarine without trans fats. Vegetable oil. Low-fat, reduced-fat, or light mayonnaise and salad dressings (reduced-sodium). Canola, safflower, olive, soybean, and sunflower oils. Avocado. °Seasoning and other foods °Herbs. Spices. Seasoning mixes without salt. Unsalted popcorn and pretzels. Fat-free sweets. °What foods are not recommended? °The items listed may not be a complete list. Talk with your dietitian about what dietary choices are best for you. °Grains °Baked goods made with fat, such as croissants, muffins, or some breads. Dry pasta or rice meal packs. °Vegetables °Creamed or fried vegetables. Vegetables in a cheese sauce. Regular canned vegetables (not low-sodium or reduced-sodium). Regular canned tomato sauce and paste (not low-sodium or reduced-sodium). Regular tomato and vegetable juice (not low-sodium or reduced-sodium). Pickles. Olives. °Fruits °Canned fruit in a light or heavy syrup. Fried fruit. Fruit in cream or butter sauce. °Meat and other protein foods °Fatty cuts of meat. Ribs. Fried meat. Bacon. Sausage. Bologna and other processed lunch meats. Salami. Fatback. Hotdogs. Bratwurst. Salted nuts and seeds. Canned beans with added salt. Canned or smoked fish. Whole eggs or egg yolks. Chicken or turkey with skin. °Dairy °Whole or 2% milk, cream, and half-and-half. Whole or full-fat cream cheese. Whole-fat or sweetened yogurt. Full-fat cheese. Nondairy creamers. Whipped toppings.  Processed cheese and cheese spreads. °Fats and oils °Butter. Stick margarine. Lard. Shortening. Ghee. Bacon fat. Tropical oils, such as coconut, palm kernel, or palm oil. °Seasoning and other foods °Salted popcorn and pretzels. Onion salt, garlic salt, seasoned salt, table salt, and sea salt. Worcestershire sauce. Tartar sauce. Barbecue sauce. Teriyaki sauce. Soy sauce, including reduced-sodium. Steak sauce. Canned and packaged gravies. Fish sauce. Oyster sauce. Cocktail sauce. Horseradish that you find on the shelf. Ketchup. Mustard. Meat flavorings and tenderizers. Bouillon cubes. Hot sauce and Tabasco sauce. Premade or packaged marinades. Premade or packaged taco seasonings. Relishes. Regular salad dressings. °Where to find more information: °· National Heart, Lung, and Blood Institute: www.nhlbi.nih.gov °· American Heart Association: www.heart.org °Summary °· The DASH eating plan is a healthy eating plan that has been shown to reduce high blood pressure (hypertension). It may also reduce your risk for type 2 diabetes, heart disease, and stroke. °· With the DASH eating plan, you should limit salt (sodium) intake to 2,300 mg a day. If you have hypertension, you may need to reduce your sodium intake to 1,500 mg a day. °· When on the DASH eating plan, aim to eat more fresh fruits and vegetables, whole grains, lean proteins, low-fat dairy, and heart-healthy fats. °· Work with your health care provider or diet and nutrition specialist (dietitian) to adjust your eating plan to your   individual calorie needs. This information is not intended to replace advice given to you by your health care provider. Make sure you discuss any questions you have with your health care provider. Document Released: 09/06/2011 Document Revised: 09/10/2016 Document Reviewed: 09/10/2016 Elsevier Interactive Patient Education  2019 [employer]  Sertraline tablets What is this medicine? SERTRALINE (SER tra leen) is used to treat  depression. It may also be used to treat obsessive compulsive disorder, panic disorder, post-trauma stress, premenstrual dysphoric disorder (PMDD) or social anxiety. This medicine may be used for other purposes; ask your health care provider or pharmacist if you have questions. COMMON BRAND NAME(S): Zoloft What should I tell my health care provider before I take this medicine? They need to know if you have any of these conditions: -bleeding disorders -bipolar disorder or a family history of bipolar disorder -glaucoma -heart disease -high blood pressure -history of irregular heartbeat -history of low levels of calcium, magnesium, or potassium in the blood -if you often drink alcohol -liver disease -receiving electroconvulsive therapy -seizures -suicidal thoughts, plans, or attempt; a previous suicide attempt by you or a family member -take medicines that treat or prevent blood clots -thyroid disease -an unusual or allergic reaction to sertraline, other medicines, foods, dyes, or preservatives -pregnant or trying to get pregnant -breast-feeding How should I use this medicine? Take this medicine by mouth with a glass of water. Follow the directions on the prescription label. You can take it with or without food. Take your medicine at regular intervals. Do not take your medicine more often than directed. Do not stop taking this medicine suddenly except upon the advice of your doctor. Stopping this medicine too quickly may cause serious side effects or your condition may worsen. A special MedGuide will be given to you by the pharmacist with each prescription and refill. Be sure to read this information carefully each time. Talk to your pediatrician regarding the use of this medicine in children. While this drug may be prescribed for children as young as 7 years for selected conditions, precautions do apply. Overdosage: If you think you have taken too much of this medicine contact a poison  control center or emergency room at once. NOTE: This medicine is only for you. Do not share this medicine with others. What if I miss a dose? If you miss a dose, take it as soon as you can. If it is almost time for your next dose, take only that dose. Do not take double or extra doses. What may interact with this medicine? Do not take this medicine with any of the following medications: -cisapride -dofetilide -dronedarone -linezolid -MAOIs like Carbex, Eldepryl, Marplan, Nardil, and Parnate -methylene blue (injected into a vein) -pimozide -thioridazine This medicine may also interact with the following medications: -alcohol -amphetamines -aspirin and aspirin-like medicines -certain medicines for depression, anxiety, or psychotic disturbances -certain medicines for fungal infections like ketoconazole, fluconazole, posaconazole, and itraconazole -certain medicines for irregular heart beat like flecainide, quinidine, propafenone -certain medicines for migraine headaches like almotriptan, eletriptan, frovatriptan, naratriptan, rizatriptan, sumatriptan, zolmitriptan -certain medicines for sleep -certain medicines for seizures like carbamazepine, valproic acid, phenytoin -certain medicines that treat or prevent blood clots like warfarin, enoxaparin, dalteparin -cimetidine -digoxin -diuretics -fentanyl -isoniazid -lithium -NSAIDs, medicines for pain and inflammation, like ibuprofen or naproxen -other medicines that prolong the QT interval (cause an abnormal heart rhythm) -rasagiline -safinamide -supplements like St. John's wort, kava kava, valerian -tolbutamide -tramadol -tryptophan This list may not describe all possible interactions. Give your health  care provider a list of all the medicines, herbs, non-prescription drugs, or dietary supplements you use. Also tell them if you smoke, drink alcohol, or use illegal drugs. Some items may interact with your medicine. What should I watch  for while using this medicine? Tell your doctor if your symptoms do not get better or if they get worse. Visit your doctor or health care professional for regular checks on your progress. Because it may take several weeks to see the full effects of this medicine, it is important to continue your treatment as prescribed by your doctor. Patients and their families should watch out for new or worsening thoughts of suicide or depression. Also watch out for sudden changes in feelings such as feeling anxious, agitated, panicky, irritable, hostile, aggressive, impulsive, severely restless, overly excited and hyperactive, or not being able to sleep. If this happens, especially at the beginning of treatment or after a change in dose, call your health care professional. Bonita Quin may get drowsy or dizzy. Do not drive, use machinery, or do anything that needs mental alertness until you know how this medicine affects you. Do not stand or sit up quickly, especially if you are an older patient. This reduces the risk of dizzy or fainting spells. Alcohol may interfere with the effect of this medicine. Avoid alcoholic drinks. Your mouth may get dry. Chewing sugarless gum or sucking hard candy, and drinking plenty of water may help. Contact your doctor if the problem does not go away or is severe. What side effects may I notice from receiving this medicine? Side effects that you should report to your doctor or health care professional as soon as possible: -allergic reactions like skin rash, itching or hives, swelling of the face, lips, or tongue -anxious -black, tarry stools -changes in vision -confusion -elevated mood, decreased need for sleep, racing thoughts, impulsive behavior -eye pain -fast, irregular heartbeat -feeling faint or lightheaded, falls -feeling agitated, angry, or irritable -hallucination, loss of contact with reality -loss of balance or coordination -loss of memory -painful or prolonged  erections -restlessness, pacing, inability to keep still -seizures -stiff muscles -suicidal thoughts or other mood changes -trouble sleeping -unusual bleeding or bruising -unusually weak or tired -vomiting Side effects that usually do not require medical attention (report to your doctor or health care professional if they continue or are bothersome): -change in appetite or weight -change in sex drive or performance -diarrhea -increased sweating -indigestion, nausea -tremors This list may not describe all possible side effects. Call your doctor for medical advice about side effects. You may report side effects to FDA at 1-800-FDA-1088. Where should I keep my medicine? Keep out of the reach of children. Store at room temperature between 15 and 30 degrees C (59 and 86 degrees F). Throw away any unused medicine after the expiration date. NOTE: This sheet is a summary. It may not cover all possible information. If you have questions about this medicine, talk to your doctor, pharmacist, or health care provider.  2019 Elsevier/Gold Standard (2016-09-21 14:17:49)

## 2018-11-11 NOTE — Progress Notes (Signed)
Patient arrived to unit 2 c bed 16 from emergency room.Assisted to bed by nursing staff.Oriented patient to nursing unit and call bell system.Educated patient to only get out of bed with assistance from nursing staff and verbalized understanding.Patient denies pain or shortness of breath at present time.No acute distress noted.Will continue to monitor.

## 2018-11-11 NOTE — Final Progress Note (Signed)
Pt discharged with all personal belongings. VS stable. Pt was escorted off unit via wheelchair with tech. Discharge instructions given by previous nurse.

## 2018-11-11 NOTE — Progress Notes (Addendum)
Pt had an episode of chest pain , clutching chest with c/o pain on inhalation, hr increased to 102,medicated with nitroglycerine x3.had good effect .observation continues.

## 2018-11-11 NOTE — Progress Notes (Signed)
  Echocardiogram 2D Echocardiogram has been performed.  Celene Skeen 11/11/2018, 10:05 AM

## 2018-11-11 NOTE — Care Management Note (Signed)
Case Management Note  Patient Details  Name: TURAN QUINTILIANI MRN: [id number] Date of Birth: 1961/02/04  Subjective/Objective:    Chest  Pain, right side weakness                Action/Plan:  Spoke to pt at bedside. Provided pt with [facility] brochure with appt scheduled on 2/24 at 930 am. Explained he will have to apply for [facility] to see PCP. Pt is currently not working and does not have insurance. He is agreeable to coming to [facility] until he is approved to go to [facility]. Provided information on [facility] which is a $125 copay. [facility] is a walk in clinic. Pt able to afford $13 for his meds. Provided info on [email or web address].     Expected Discharge Date:                  Expected Discharge Plan:  Home/Self Care  In-House Referral:  NA  Discharge planning Services  CM Consult, Medication Assistance, Follow-up appt scheduled  Post Acute Care Choice:  NA Choice offered to:  NA  DME Arranged:  N/A DME Agency:  NA  HH Arranged:  NA HH Agency:  NA  Status of Service:  Completed, signed off  If discussed at Long Length of Stay Meetings, dates discussed:    Additional Comments:  Elliot Cousin, RN 11/11/2018, 1:09 PM

## 2018-11-11 NOTE — Discharge Summary (Signed)
Name: Evan Barker MRN: [id number] DOB: April 21, 1961 58 y.o. PCP: Patient, No Pcp Per  Date of Admission: 11/10/2018  2:54 PM Date of Discharge: 11/11/18 Attending Physician: Levert Feinstein, MD  Discharge Diagnosis: 1. Unspecified Chest pain 2. Right sided weakness 3. Insomnia 4. GERD 5. Tobacco abuse 6. Pre-diabetes   Discharge Medications: Allergies as of 11/11/2018      Reactions   Aspirin Rash   Penicillins Rash   Has patient had a PCN reaction causing immediate rash, facial/tongue/throat swelling, SOB or lightheadedness with hypotension: Yes Has patient had a PCN reaction causing severe rash involving mucus membranes or skin necrosis: No Has patient had a PCN reaction that required hospitalization: No Has patient had a PCN reaction occurring within the last 10 years: No If all of the above answers are "NO", then may proceed with Cephalosporin use.      Medication List    STOP taking these medications   traMADol 50 MG tablet Commonly known as:  ULTRAM   traZODone 50 MG tablet Commonly known as:  DESYREL     TAKE these medications   acetaminophen 500 MG tablet Commonly known as:  TYLENOL Take 500-1,000 mg by mouth every 6 (six) hours as needed for mild pain, moderate pain, fever or headache.   atorvastatin 40 MG tablet Commonly known as:  LIPITOR Take 1 tablet (40 mg total) by mouth daily.   ibuprofen 200 MG tablet Commonly known as:  ADVIL,MOTRIN Take 200-600 mg by mouth every 6 (six) hours as needed for fever, headache, mild pain, moderate pain or cramping.   omeprazole 20 MG capsule Commonly known as:  PRILOSEC Take 20 mg by mouth at bedtime.   sertraline 25 MG tablet Commonly known as:  ZOLOFT Take 1 tablet (25 mg total) by mouth at bedtime.       Disposition and follow-up:   Mr.Riccardo S Ly was discharged from [facility] in Stable condition.  At the hospital follow up visit please address:  1.  Anxiety and depression:  discontinued trazodone and started sertraline 25mg  qd. Please titrate as needed. Instructed to take melatonin prn qhs.    New pre-diabetes diagnosis: encourage lifestyle modifications.   Encourage smoking cessation   2.  Labs / imaging needed at time of follow-up: none  3.  Pending labs/ test needing follow-up: none  Follow-up Appointments: Follow-up Information    [facility].   Specialty:  Family Medicine Why:  please go by office and pick up sliding scale application, once complete and approved, the office will arrange appointment Contact information: [address] [place] [address] [phone]        [facility] Follow up.   Why:  appointment on 11/24/2018 at 9:30 am Contact information: [address] [place] [address] [phone]          Hospital Course by problem list: 1. Unspecified Chest pain: Patient presented to the ED with unspecified chest pain that is not relieved by nitroglycerin. Troponins were done and were negative x3. An EKG was also done in the ED and was reassuring for no acute ischemic changes. An echo showed EF 55-60%, mild LVH, no valvular abdnormalities. Patient has not complained of any chest pain since admission and discussion with patient and family about symptoms being related to the patient's anxiety/depression/panic disorder than to any cardiac symptoms. Patient was open to the discussion and was agreeable to switching from trazodone to  sertraline 25 mg qd. During initial workup patient's cholesterol was found to be elevated and patient was informed and started on atorvastatin 40 mg qd. Patient will be following up with a PCP upon discharge per recommendation.  2. Right sided weakness: Patient presented to the ED with complaints of intermittent right sided weakness and balance issues. His neuro exam revealed 4+ strength in right arm and leg with decreased  sensation on right leg. CN2-12 were intact. Cerebellar testing was slow but accurate. A head CT was was done that showed no acute or chronic pathology and neurologic symptoms more likely to be related to back surgery or panic attack. Patient was informed of his risk for stroke due to his elevated cholesterol and was started on atorvastatin 40 mg qd.  3. Insomnia: Patient's home medication of trazodone was discontinued during his hospitalization since he was started on sertraline. He was instructed to try melatonin prn qhs.  4. GERD: Patient continued his home medication of pantoprazole.  5. Tobacco abuse: Patient was given a nicotine patch during his hospitalization. Encouraged smoking cessation.   6. Pre-diabetes: a1c 5.8, endorses polyuria and polydipsia. Given information on DASH diet   Discharge Vitals:   BP 127/89 (BP Location: Left Arm)   Pulse 79   Temp 98.1 F (36.7 C) (Oral)   Resp 16   Ht 6\' 2"  (1.88 m)   Wt 100.9 kg   SpO2 96%   BMI 28.56 kg/m   Pertinent Labs, Studies, and Procedures:  CBC Latest Ref Rng & Units 11/10/2018 11/10/2018 04/18/2018  WBC 4.0 - 10.5 K/uL 10.2 9.8 12.0(H)  Hemoglobin 13.0 - 17.0 g/dL 32.9 92.4 26.8  Hematocrit 39.0 - 52.0 % 48.1 45.6 49.7  Platelets 150 - 400 K/uL 174 253 271    Lipid Panel     Component Value Date/Time   CHOL 228 (H) 11/10/2018 1909   TRIG 365 (H) 11/10/2018 1909   HDL 34 (L) 11/10/2018 1909   CHOLHDL 6.7 11/10/2018 1909   VLDL 73 (H) 11/10/2018 1909   LDLCALC 121 (H) 11/10/2018 1909   Head CT:  Brain: No evidence of acute infarction, hemorrhage, hydrocephalus, extra-axial collection or mass lesion/mass effect.  Vascular: No hyperdense vessel or unexpected calcification.  Skull: Intact.  No focal lesion.  Sinuses/Orbits: Negative.  Echocardiogram:  1. The left ventricle has normal systolic function of 55-60%. The cavity size was normal. There is mild concentric left ventricular hypertrophy. Left ventricular  diastolic Doppler parameters are consistent with impaired relaxation. No evidence of left  ventricular regional wall motion abnormalities.  2. The right ventricle has normal systolic function. The cavity was normal. There is no increase in right ventricular wall thickness. Right ventricular systolic pressure could not be assessed.  3. The tricuspid valve is normal in structure.  4. The pulmonic valve was normal in structure.  5. No evidence of left ventricular regional wall motion abnormalities.  6. The aortic valve is normal in structure.  7. The mitral valve is normal in structure.  CXR: no active cardiopulmonary findings   Discharge Instructions: Discharge Instructions    Diet - low sodium heart healthy   Complete by:  As directed    Discharge instructions   Complete by:  As directed    Mr. Grizzle,  It was a pleasure taking care of you. I'm glad that nothing major is wrong with your heart or brain. You had absolutely no signs of a heart attack of stroke. The ultrasound of your heart showed great function.  Your cholesterol was elevated so I have started you on a medicine called atorvastatin (Lipitor). We also screened you for diabetes and that number was a little elevated, meaning you have prediabetes. For this, we recommend you make changes to your diet and exercise. I have attached some dietary guidelines for you to review.   For you stress levels, I have started you on a medicine called sertraline (Zoloft), it works similarly to Trazodone but I'm hoping it will help control your level of stress/anxiety better. Pleas stop taking Trazodone and start taking sertraline once every day. If you need something extra to help you sleep, you can take melatonin 3-5mg  as needed.   I have made you an appointment with [facility] in [place] on March 12th at 10:20am to get established with Dr. Leonor LivHolt. If you do not wish to keep this appointment, please call their office at [phone] to  cancel. I know our case manager has provided you with other resources for establishing with a primary care provider. You will need to follow up with a doctor so you can continue getting your medications managed. The sertraline dosage will likely need to be increased but we like to go up on that slowly to make sure you are tolerating it without side effects.   If you need anything before you are able to get in with a new primary care doctor, please call our clinic at [phone] and we can try to assist you.  Best wishes,  Dr. Petra KubaVogel   Increase activity slowly   Complete by:  As directed       Signed: Ali LoweVogel, Marie S, MD 11/11/2018, 4:06 PM   Pager: [phone]

## 2018-11-11 NOTE — Progress Notes (Signed)
   Subjective: Continues to endorse anxiety and chest pain. Incompletely resolved with nitroglycerin. Discussed his reassuring stroke and cardiac work up. Echo is pending. He reports feeling stressed and depressed and going through a lot this past year. Discussed the likelihood of his symptoms coming from anxiety/depression. He is amenable to trying medication for this. Once echo is read, we will plan for discharge and arrange outpt f/u in [place].   Objective:  Vital signs in last 24 hours: Vitals:   11/10/18 1857 11/11/18 0018 11/11/18 0427 11/11/18 0830  BP: (!) 133/94 (!) 136/96 121/60 135/81  Pulse: 82 94 79   Resp: 16 18 16    Temp:  98 F (36.7 C) 98.1 F (36.7 C) 98.2 F (36.8 C)  TempSrc:  Oral Oral Oral  SpO2: 99% 98% 96%   Weight:  100.9 kg    Height:  6\' 2"  (1.88 m)     Gen: laying in bed, girlfriend at bedside, NAD Cardiac: RRR, no m/r/g Pulm: CTAB Ext: warm, well perfused  Neuro: CN 2-12 intact, patellar reflexes symmetric, moving all extremities equally   Assessment/Plan:  Active Problems:   Chest pain, rule out acute myocardial infarction  57yoM with no routine medical care, at least a 55 pack year smoking history presenting with typical anginal chest pain occurring at rest.   Chest Pain: troponin negative x 3 and ekg reassuring. Chest pain is not completely relieved with nitroglycerin. Echo pending. Doubt cardiac ischemia. Cholesterol is elevated with calculated ASCVD score is 16.9% 68yr risk of cardiovascular event. a1c in the pre-diabetes range. Discussed overall reassuring work up. Symptoms seem to fit more with anxiety/panic attack disorder. He has been taking trazodone for several months for sleep. Discussed switching to a different SSRI and arranging outpt f/u in [place].  - f/u echo - start atorvastatin 40mg  qd  - discussed lifestyle changes for pre-diabetes  - discontinue trazodone - start sertraline 25mg  qd - arrange outpt f/u in 1-2 weeks   Right  sided weakness: head CT was negative for any acute or chronic pathology. Neuro symptoms more likely related to prior back surgeries. Discussed risk for stroke and risk factor modification as above.    Insomnia: discontinue trazodone, can take melatonin prn  GERD: continue home pantoprazole Tobacco abuse: 1-2 packs per day, will order nicotine patch   Dispo: Anticipated discharge today   Ali Lowe, MD 11/11/2018, 10:55 AM Pager: [phone]

## 2018-11-24 ENCOUNTER — Inpatient Hospital Stay: Payer: Self-pay | Admitting: Family Medicine

## 2021-01-31 IMAGING — CT CT HEAD W/O CM
3 series · 16 of 47 positions shown, 19 images · non-contrast
Comparison: None.

CLINICAL DATA: Acute onset chest pain radiating to both arms today.
The patient has suffered 3 similar episodes since August 2018.

EXAM:
CT HEAD WITHOUT CONTRAST
TECHNIQUE: Contiguous axial images were obtained from the base of the skull
through the vertex without intravenous contrast.

[Series 3: head 5.0 h30s · axial · 0.45mm/px · z∈[-101,+39]mm · 10 of 34 slices shown, 13 images]
[im 3/34  brain]
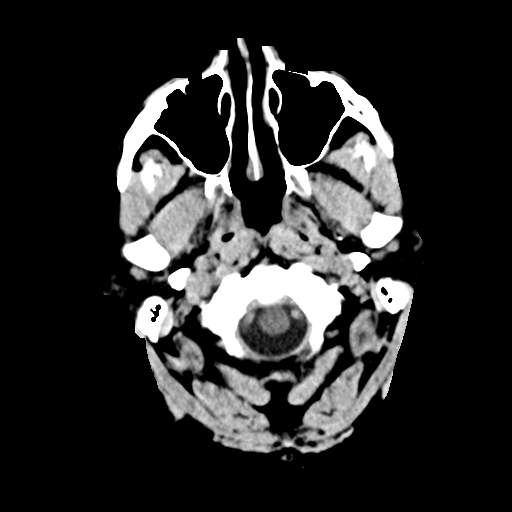
[im 3/34  bone]
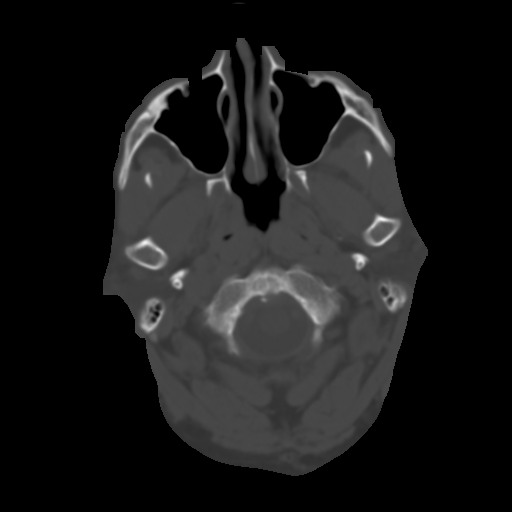
[im 6/34  brain]
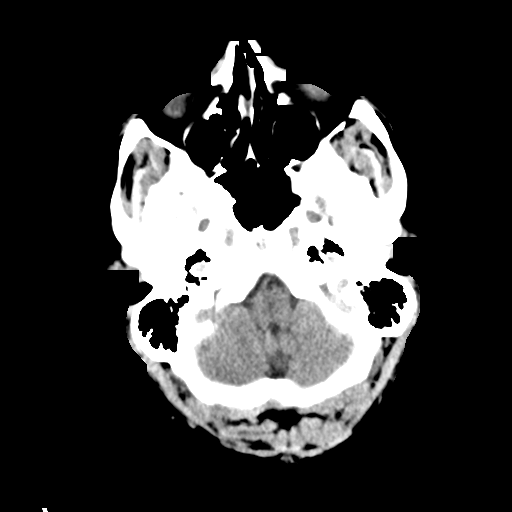
[im 10/34  brain]
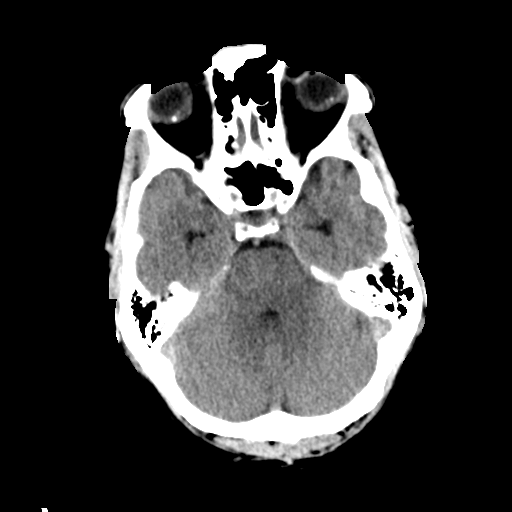
[im 12/34  brain]
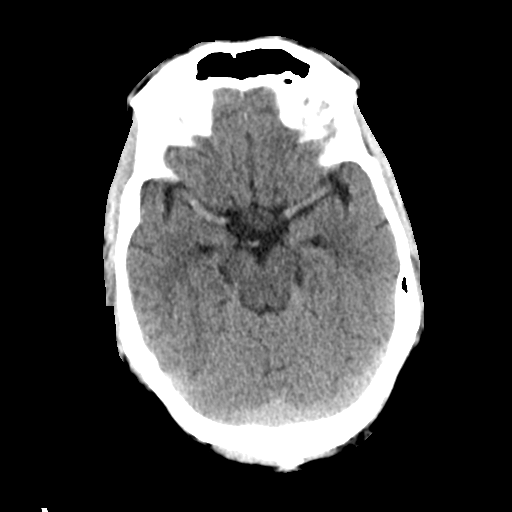
[im 15/34  brain]
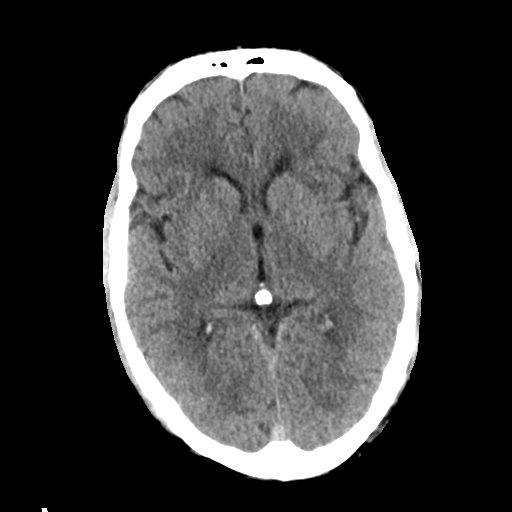
[im 15/34  bone]
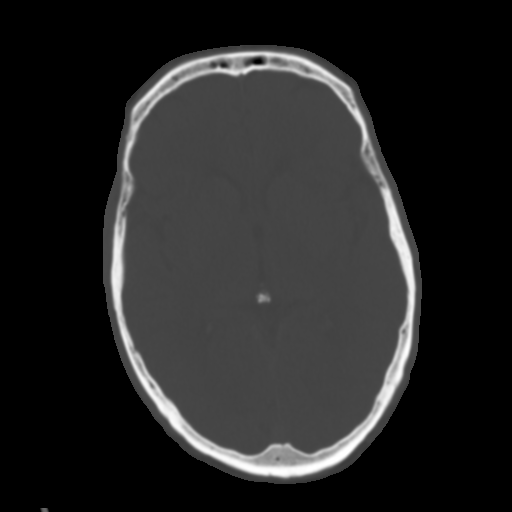
[im 19/34  brain]
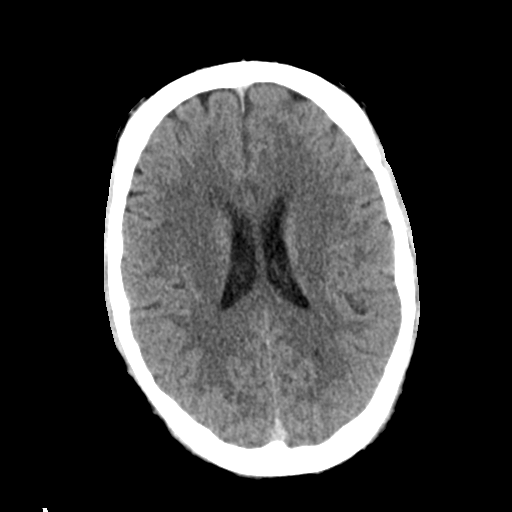
[im 22/34  brain]
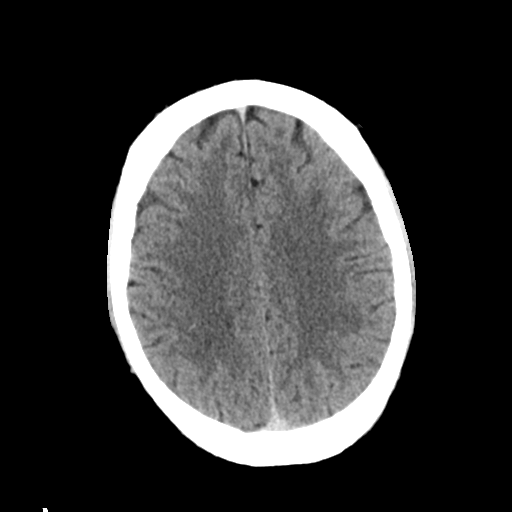
[im 26/34  brain]
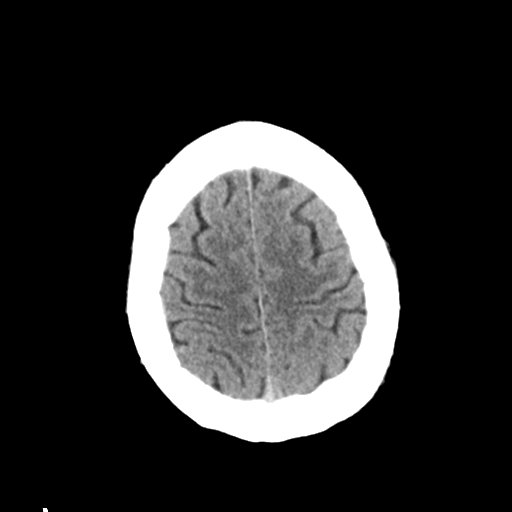
[im 28/34  brain]
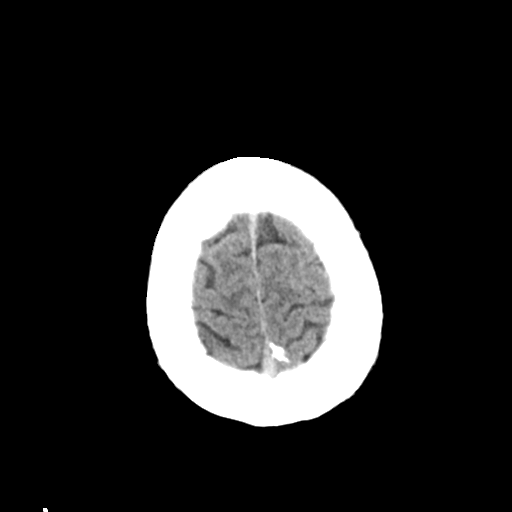
[im 28/34  bone]
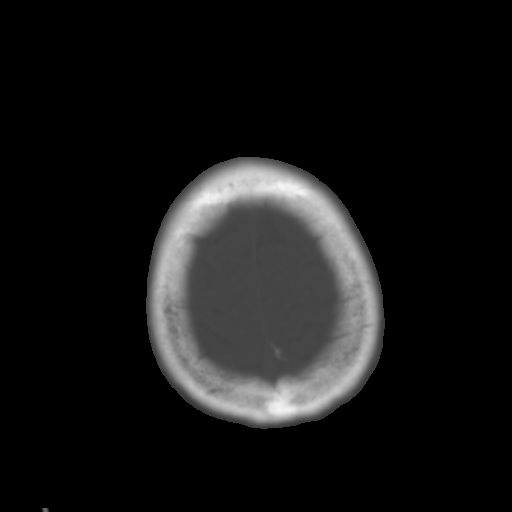
[im 31/34  brain]
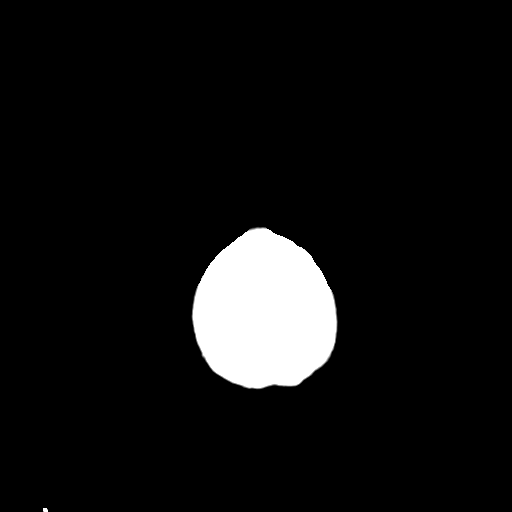

[Series 5: head 3.0 mpr cor · coronal · 0.35mm/px · 3 of 69 slices shown]
[im 23/69  brain]
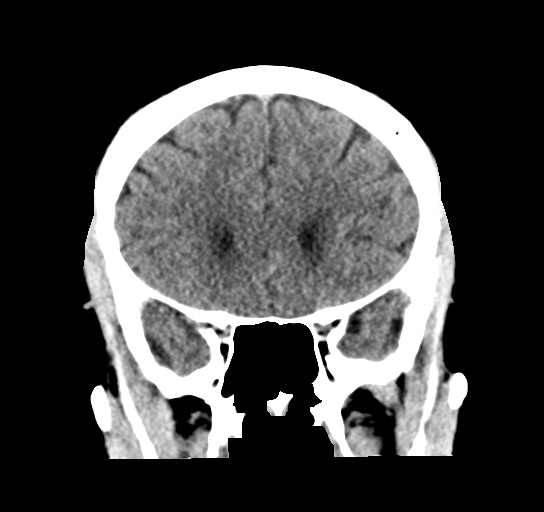
[im 31/69  brain]
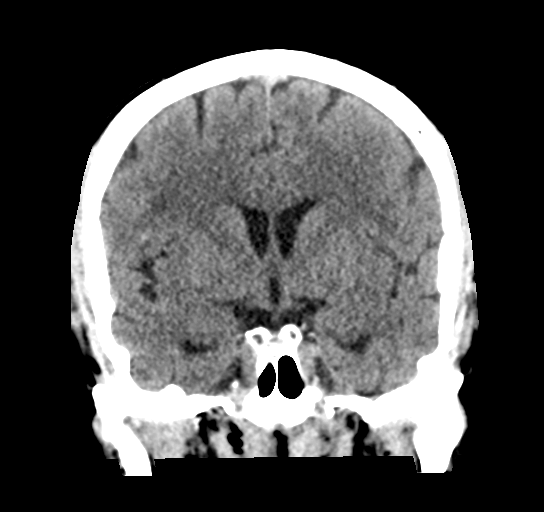
[im 38/69  brain]
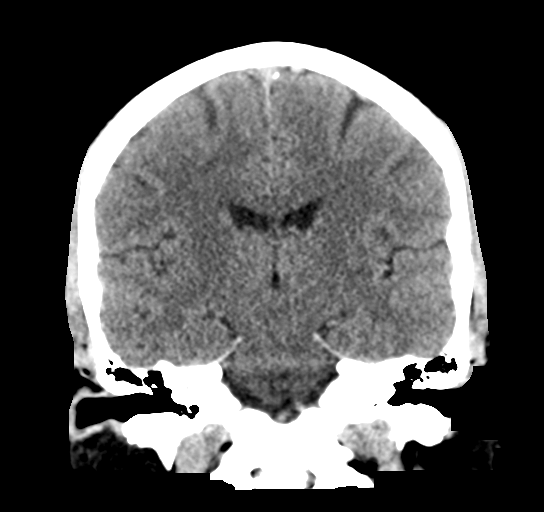

[Series 6: head 3.0 mpr sag · sagittal · 0.33mm/px · 3 of 59 slices shown]
[im 20/59  brain]
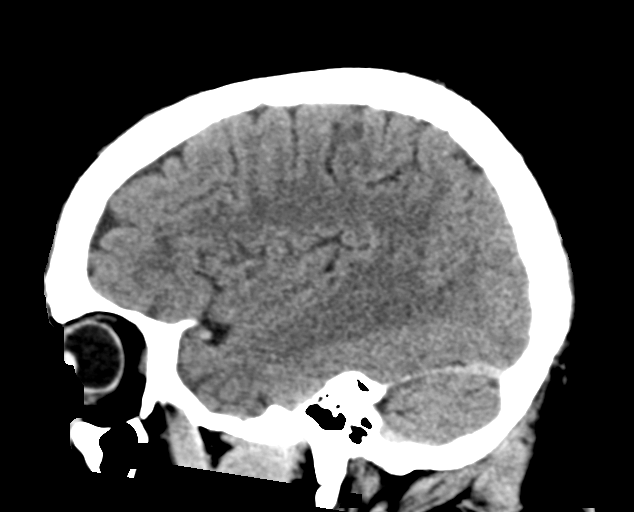
[im 30/59  brain]
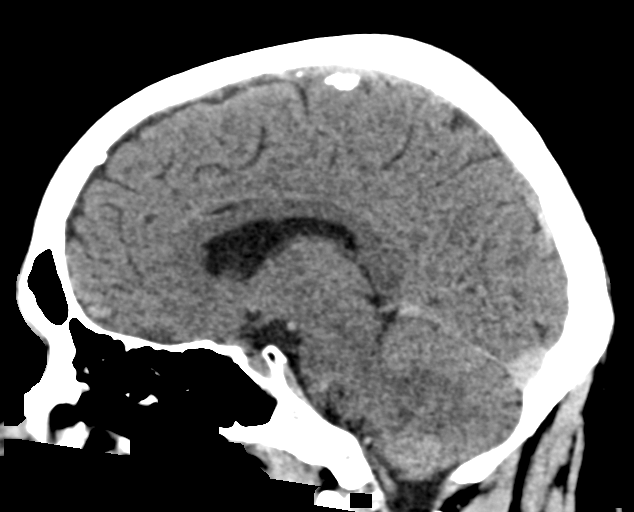
[im 39/59  brain]
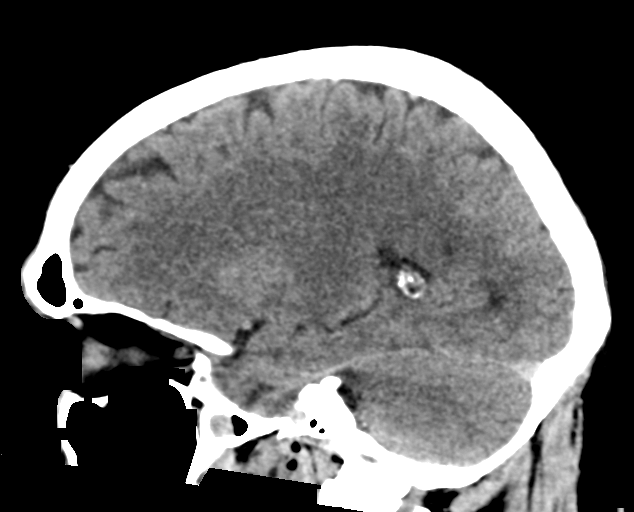

[16 of 47 positions shown; findings below may reference images not displayed]

FINDINGS: Brain: No evidence of acute infarction, hemorrhage, hydrocephalus,
extra-axial collection or mass lesion/mass effect.

Vascular: No hyperdense vessel or unexpected calcification.

Skull: Intact.  No focal lesion.

Sinuses/Orbits: Negative.

Other: None.
IMPRESSION: Negative head CT.

## 2022-10-31 ENCOUNTER — Encounter: Payer: Self-pay | Admitting: Orthopaedic Surgery

## 2022-10-31 ENCOUNTER — Ambulatory Visit (INDEPENDENT_AMBULATORY_CARE_PROVIDER_SITE_OTHER): Payer: Medicaid Other

## 2022-10-31 ENCOUNTER — Ambulatory Visit (INDEPENDENT_AMBULATORY_CARE_PROVIDER_SITE_OTHER): Payer: Medicaid Other | Admitting: Orthopaedic Surgery

## 2022-10-31 VITALS — BP 138/81 | HR 66 | Ht 74.0 in | Wt 228.0 lb

## 2022-10-31 DIAGNOSIS — M549 Dorsalgia, unspecified: Secondary | ICD-10-CM

## 2022-10-31 DIAGNOSIS — M545 Low back pain, unspecified: Secondary | ICD-10-CM

## 2022-10-31 DIAGNOSIS — G8929 Other chronic pain: Secondary | ICD-10-CM

## 2022-10-31 DIAGNOSIS — M542 Cervicalgia: Secondary | ICD-10-CM | POA: Diagnosis not present

## 2022-10-31 NOTE — Progress Notes (Signed)
Office Visit Note   Patient: Evan Barker           Date of Birth: [birth year]           MRN: [id number] Visit Date: 10/31/2022              Requested by: Ronita Hipps, MD [address] [place] [address],  PCP: Ronita Hipps, MD   Assessment & Plan: Visit Diagnoses:  1. Neck pain   2. Mid back pain   3. Chronic right-sided low back pain, unspecified whether sciatica present     Plan: Patient with multiple complaints of back and lumbar spine .  Some of his thoracic pain or pain between his shoulders may be related to cervical spondylosis.  Will set him up for some therapy and see him back after he completes therapy.  Follow-Up Instructions: No follow-ups on file.   Orders:  Orders Placed This Encounter  Procedures   XR Cervical Spine 2 or 3 views   XR Lumbar Spine 2-3 Views   XR Thoracic Spine 2 View   No orders of the defined types were placed in this encounter.     Procedures: No procedures performed   Clinical Data: No additional findings.   Subjective: Chief Complaint  Patient presents with   Neck - Pain   Middle Back - Pain   Lower Back - Pain    HPI 62 year old male new patient visit with complaints of shoulder pain on the left, cervical pain, thoracic pain and lumbar pain.  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  He states 1 year ago he fell headfirst off a truck.  He has had 2 lumbar surgeries 2008 or 2010 he says that was in [place] but he does not recall who did the surgery.  Oldest PACS images dates back to 2010.  Patient not sure what hospital he had his surgery.  Review of Systems negative for stroke or MI positive for alcohol consumption, bilateral foot numbness, bilateral shoulder pain.  All other systems noncontributory.  Positive smoking history.   Objective: Vital Signs: BP 138/81   Pulse 66   Ht 6\' 2"  (1.88 m)   Wt 228 lb (103.4 kg)   BMI 29.27 kg/m   Physical Exam Constitutional:       Appearance: He is well-developed.  HENT:     Head: Normocephalic and atraumatic.     Right Ear: External ear normal.     Left Ear: External ear normal.  Eyes:     Pupils: Pupils are equal, round, and reactive to light.  Neck:     Thyroid: No thyromegaly.     Trachea: No tracheal deviation.  Cardiovascular:     Rate and Rhythm: Normal rate.  Pulmonary:     Effort: Pulmonary effort is normal.     Breath sounds: No wheezing.  Abdominal:     General: Bowel sounds are normal.     Palpations: Abdomen is soft.  Musculoskeletal:     Cervical back: Neck supple.  Skin:    General: Skin is warm and dry.     Capillary Refill: Capillary refill takes less than 2 seconds.  Neurological:     Mental Status: He is alert and oriented to person, place, and time.  Psychiatric:        Behavior: Behavior normal.        Thought Content: Thought content normal.        Judgment: Judgment normal.  Ortho Exam patient slow getting from sitting to standing and ambulation he has decreased cervical range of motion tenderness over the trapezius biceps tendon positive pain with impingement he can get both arms over his head upper extremities lower extremity reflexes are 2+ negative logroll the hips anterior tib gastrocsoleus is strong.  Increased pain with cervical compression no lower extremity clonus.  Specialty Comments:  No specialty comments available.  Imaging: No results found.   PMFS History: Patient Active Problem List   Diagnosis Date Noted   Chest pain, rule out acute myocardial infarction 11/10/2018   Past Medical History:  Diagnosis Date   Anxiety    Fracture, skull ([place])    Hernia of abdominal cavity    Shoulder dislocation     Family History  Problem Relation Age of Onset   Heart disease Maternal Uncle    Heart disease Paternal Uncle    Heart murmur Daughter     Past Surgical History:  Procedure Laterality Date   BACK SURGERY     Social History   Occupational History    Not on file  Tobacco Use   Smoking status: Every Day    Packs/day: 1.00    Types: Cigarettes   Smokeless tobacco: Never  Substance and Sexual Activity   Alcohol use: Not Currently   Drug use: Never   Sexual activity: Not on file

## 2022-11-06 NOTE — Therapy (Incomplete)
OUTPATIENT PHYSICAL THERAPY THORACOLUMBAR EVALUATION   Patient Name: Evan Barker MRN: [id number] DOB:07-17-1961,61 y.o., male 46 Date: 11/06/2022   END OF SESSION:    Past Medical History:  Diagnosis Date   Anxiety    Fracture, skull ([place])    Hernia of abdominal cavity    Shoulder dislocation    Past Surgical History:  Procedure Laterality Date   BACK SURGERY     Patient Active Problem List   Diagnosis Date Noted   Chest pain, rule out acute myocardial infarction 11/10/2018    PCP: Ronita Hipps, MD   REFERRING PROVIDER: Marybelle Killings, MD   REFERRING DIAG:  M54.2 (ICD-10-CM) - Neck pain  M54.9 (ICD-10-CM) - Mid back pain  M54.50,G89.29 (ICD-10-CM) - Chronic right-sided low back pain, unspecified whether sciatica present    Rationale for Evaluation and Treatment: Rehabilitation  THERAPY DIAG:  No diagnosis found.  ONSET DATE: chronic   SUBJECTIVE:                                                                                                                                                                                           SUBJECTIVE STATEMENT: ***  PERTINENT HISTORY:  ***  PAIN:  Are you having pain? {OPRCPAIN:27236}  PRECAUTIONS: {Therapy precautions:24002}  WEIGHT BEARING RESTRICTIONS: {Yes ***/No:24003}  FALLS:  Has patient fallen in last 6 months? {fallsyesno:27318}  LIVING ENVIRONMENT: Lives with: {[facility] lives with:25569::"lives with their family"} Lives in: {Lives in:25570} Stairs: {opstairs:27293} Has following equipment at home: {Assistive devices:23999}  OCCUPATION: ***  PLOF: {PLOF:24004}  PATIENT GOALS: ***   OBJECTIVE:   DIAGNOSTIC FINDINGS:  Cervical X-ray: AP lateral cervical spine images demonstrate disc space narrowing and  spurring at C4-5, C5-6 maintained cervical lordosis.  Uncovertebral  changes seen on AP x-ray.   Impression: Mid cervical spondylosis as described above.   Thoracic X-ray: AP  lateral thoracic images demonstrate multilevel endplate spurring right  and left.  Negative for compression.  Multilevel thoracic disc space  narrowing.   Impression: Thoracic spondylosis.  No acute changes.   Lumbar X-ray: AP lateral lumbar spine images demonstrates significant narrowing L4-5  image.  Calcification abdominal aorta and common iliacs.  Negative for  fracture.   Impression: L4-5 disc degeneration.  No acute changes.    PATIENT SURVEYS:  {rehab surveys:24030}  SCREENING FOR RED FLAGS: Bowel or bladder incontinence: {Yes/No:304960894} Spinal tumors: {Yes/No:304960894} Cauda equina syndrome: {Yes/No:304960894} Compression fracture: {Yes/No:304960894} Abdominal aneurysm: {Yes/No:304960894}  COGNITION: Overall cognitive status: {cognition:24006}     SENSATION: {sensation:27233}  MUSCLE LENGTH: Hamstrings: Right *** deg; Left *** deg Thomas test: Right *** deg; Left *** deg  POSTURE: {  posture:25561}  PALPATION: ***  LUMBAR ROM:   AROM eval  Flexion   Extension   Right lateral flexion   Left lateral flexion   Right rotation   Left rotation    (Blank rows = not tested)  LOWER EXTREMITY ROM:     Active  Right eval Left eval  Hip flexion    Hip extension    Hip abduction    Hip adduction    Hip internal rotation    Hip external rotation    Knee flexion    Knee extension    Ankle dorsiflexion    Ankle plantarflexion    Ankle inversion    Ankle eversion     (Blank rows = not tested)  LOWER EXTREMITY MMT:    MMT Right eval Left eval  Hip flexion    Hip extension    Hip abduction    Hip adduction    Hip internal rotation    Hip external rotation    Knee flexion    Knee extension    Ankle dorsiflexion    Ankle plantarflexion    Ankle inversion    Ankle eversion     (Blank rows = not tested) CERVICAL ROM:   {AROM/PROM:27142} ROM A/PROM (deg) eval  Flexion   Extension   Right lateral flexion   Left lateral flexion   Right  rotation   Left rotation    (Blank rows = not tested) UPPER EXTREMITY MMT:  MMT Right eval Left eval  Shoulder flexion    Shoulder extension    Shoulder abduction    Shoulder adduction    Shoulder extension    Shoulder internal rotation    Shoulder external rotation    Middle trapezius    Lower trapezius    Elbow flexion    Elbow extension    Wrist flexion    Wrist extension    Wrist ulnar deviation    Wrist radial deviation    Wrist pronation    Wrist supination    Grip strength     (Blank rows = not tested)  SPECIAL TESTS:  ***  FUNCTIONAL TESTS:  {Functional tests:24029}  GAIT: Distance walked: *** Assistive device utilized: {Assistive devices:23999} Level of assistance: {Levels of assistance:24026} Comments: ***  [facility] Adult PT Treatment:                                                DATE: ***  Therapeutic Exercise: Demonstrated and issued initial HEP.   Manual Therapy: *** Neuromuscular re-ed: *** Therapeutic Activity: Education on assessment findings that will be addressed throughout duration of POC.   Modalities: *** Self Care: ***    PATIENT EDUCATION:  Education details: see treatment Person educated: Patient Education method: Explanation, Demonstration, Tactile cues, Verbal cues, and Handouts Education comprehension: verbalized understanding, returned demonstration, verbal cues required, tactile cues required, and needs further education  HOME EXERCISE PROGRAM: ***  ASSESSMENT:  CLINICAL IMPRESSION: Patient is a 62 y.o. male who was seen today for physical therapy evaluation and treatment for multiple areas of pain including the cervical, thoracic, and lumbar region.   OBJECTIVE IMPAIRMENTS: {opptimpairments:25111}.   ACTIVITY LIMITATIONS: {activitylimitations:27494}  PARTICIPATION LIMITATIONS: {participationrestrictions:25113}  PERSONAL FACTORS: {Personal factors:25162} are also affecting patient's functional outcome.   REHAB  POTENTIAL: {rehabpotential:25112}  CLINICAL DECISION MAKING: {clinical decision making:25114}  EVALUATION COMPLEXITY: {Evaluation complexity:25115}   GOALS:  Goals reviewed with patient? Yes  SHORT TERM GOALS: Target date: ***  Patient will be independent and compliant with initial HEP.   Baseline: see above Goal status: INITIAL  2.  *** Baseline: see above  Goal status: INITIAL  3.  *** Baseline: see above  Goal status: INITIAL  4.  *** Baseline: see above  Goal status: INITIAL  LONG TERM GOALS: Target date: ***  *** Baseline: see above Goal status: INITIAL  2.  *** Baseline: see above Goal status: INITIAL  3.  *** Baseline: see above Goal status: INITIAL  4.  *** Baseline: see above Goal status: INITIAL  5.  *** Baseline: see above  Goal status: INITIAL  6.  *** Baseline:  Goal status: INITIAL  PLAN:  PT FREQUENCY: {rehab frequency:25116}  PT DURATION: {rehab duration:25117}  PLANNED INTERVENTIONS: {rehab planned interventions:25118::"Therapeutic exercises","Therapeutic activity","Neuromuscular re-education","Balance training","Gait training","Patient/Family education","Self Care","Joint mobilization"}.  PLAN FOR NEXT SESSION: review and progress HEP prn; ***   Tyreisha Ungar, PT, DPT, ATC 10/02/22 1:20 PM  Check all possible CPT codes: {cptcodes:24818}    Check all conditions that are expected to impact treatment: {Conditions expected to impact treatment:28273}   If treatment provided at initial evaluation, no treatment charged due to lack of authorization.

## 2022-11-07 ENCOUNTER — Other Ambulatory Visit: Payer: Self-pay

## 2022-11-07 ENCOUNTER — Ambulatory Visit: Payer: Medicaid Other | Attending: Orthopaedic Surgery

## 2022-11-07 ENCOUNTER — Ambulatory Visit: Payer: Medicaid Other

## 2022-11-07 DIAGNOSIS — M546 Pain in thoracic spine: Secondary | ICD-10-CM | POA: Diagnosis present

## 2022-11-07 DIAGNOSIS — M542 Cervicalgia: Secondary | ICD-10-CM | POA: Diagnosis present

## 2022-11-07 DIAGNOSIS — R293 Abnormal posture: Secondary | ICD-10-CM | POA: Diagnosis present

## 2022-11-07 DIAGNOSIS — M549 Dorsalgia, unspecified: Secondary | ICD-10-CM | POA: Diagnosis not present

## 2022-11-07 DIAGNOSIS — M545 Low back pain, unspecified: Secondary | ICD-10-CM | POA: Insufficient documentation

## 2022-11-07 DIAGNOSIS — M5459 Other low back pain: Secondary | ICD-10-CM | POA: Diagnosis present

## 2022-11-07 DIAGNOSIS — M6281 Muscle weakness (generalized): Secondary | ICD-10-CM | POA: Insufficient documentation

## 2022-11-07 DIAGNOSIS — G8929 Other chronic pain: Secondary | ICD-10-CM | POA: Diagnosis not present

## 2022-11-07 NOTE — Therapy (Signed)
OUTPATIENT PHYSICAL THERAPY EVALUATION   Patient Name: Evan Barker MRN: [id number] DOB:12-21-1960,62 y.o., male Today's Date: 11/07/2022   END OF SESSION:  PT End of Session - 11/07/22 1400     Visit Number 1    Number of Visits 17    Date for PT Re-Evaluation 01/05/23    Authorization Type MCD Narda Amber complete    PT Start Time 1400    PT Stop Time 1445    PT Time Calculation (min) 45 min    Activity Tolerance Patient tolerated treatment well    Behavior During Therapy WFL for tasks assessed/performed              Past Medical History:  Diagnosis Date   Anxiety    Fracture, skull ([place])    Hernia of abdominal cavity    Shoulder dislocation    Past Surgical History:  Procedure Laterality Date   BACK SURGERY     Patient Active Problem List   Diagnosis Date Noted   Chest pain, rule out acute myocardial infarction 11/10/2018    PCP: Ronita Hipps, MD   REFERRING PROVIDER: Marybelle Killings, MD   REFERRING DIAG:  M54.2 (ICD-10-CM) - Neck pain  M54.9 (ICD-10-CM) - Mid back pain  M54.50,G89.29 (ICD-10-CM) - Chronic right-sided low back pain, unspecified whether sciatica present    Rationale for Evaluation and Treatment: Rehabilitation  THERAPY DIAG:  Pain in thoracic spine  Cervicalgia  Other low back pain  Abnormal posture  Muscle weakness (generalized)  ONSET DATE: chronic   SUBJECTIVE:                                                                                                                                                                                           SUBJECTIVE STATEMENT: Patient reports he always hurts stating he has broken multiple bones. He has more pain between his shoulder blades (Rt mid-thoracic region) that has become "more regular over the past year." He reports it feels like a muscle spasm and feels like he can't move when he feels pain. He reports his neck/upper back pain has been ongoing for over two years of insidious  onset. His low back pain worsened about 6 months ago and is unsure if it's related to fall or working around the house. He reports a history of low back surgeries stating he received multiple laminectomies. Patient reports constant numbness about the Rt lateral calf for years. He denies any changes in bowel/bladder or saddle parasthesia. He reports history of multiple falls stating he "looses his balance due to weak ankles."   PERTINENT HISTORY:  Anxiety History of skull fracture  History of back surgery  History of bilateral shoulder dislocation  Per patient history of Lt scapula fracture   PAIN:  Are you having pain? Yes: NPRS scale: 4 neck/upper back; 6 low back/10 Pain location: neck/upper back; low back Pain description: sharp neck/upper back; dull low back Aggravating factors: neck/upper back rotating the trunk, turning to the Lt; for the low back prolonged sitting, standing,walking Relieving factors: movement   PRECAUTIONS: Fall  WEIGHT BEARING RESTRICTIONS: No  FALLS:  Has patient fallen in last 6 months? Yes. Number of falls multiple falls states he looses his balance due to weak ankles  LIVING ENVIRONMENT: Lives with: lives with their partner Lives in: House/apartment Stairs: Yes: External: 2 steps; none Has following equipment at home: None  OCCUPATION: unemployed  PLOF: Independent  PATIENT GOALS: "get me back moving where I don't hurt."    OBJECTIVE:   DIAGNOSTIC FINDINGS:  Cervical X-ray: AP lateral cervical spine images demonstrate disc space narrowing and  spurring at C4-5, C5-6 maintained cervical lordosis.  Uncovertebral  changes seen on AP x-ray.   Impression: Mid cervical spondylosis as described above.   Thoracic X-ray: AP lateral thoracic images demonstrate multilevel endplate spurring right  and left.  Negative for compression.  Multilevel thoracic disc space  narrowing.   Impression: Thoracic spondylosis.  No acute changes.   Lumbar  X-ray: AP lateral lumbar spine images demonstrates significant narrowing L4-5  image.  Calcification abdominal aorta and common iliacs.  Negative for  fracture.   Impression: L4-5 disc degeneration.  No acute changes.    PATIENT SURVEYS:  Modified Oswestry 50% disability; 25/50   SCREENING FOR RED FLAGS: Bowel or bladder incontinence: No Spinal tumors: No Cauda equina syndrome: No Compression fracture: No Abdominal aneurysm: No  COGNITION: Overall cognitive status: Within functional limits for tasks assessed     SENSATION: Diminished sensation to light touch Rt lateral calf    POSTURE: rounded shoulders, forward head, and increased thoracic kyphosis  PALPATION: Tautness and palpable tenderness bilateral thoracolumbar paraspinals, Rt middle trap/rhomboids, bilateral upper traps  LUMBAR ROM:   AROM eval  Flexion WNL  Extension 50% limited pn  Right lateral flexion 25% limited pn  Left lateral flexion 25% limited pn  Right rotation WNL  Left rotation WNL   (Blank rows = not tested)  LOWER EXTREMITY ROM:     Active  Right eval Left eval  Hip flexion    Hip extension    Hip abduction    Hip adduction    Hip internal rotation    Hip external rotation    Knee flexion    Knee extension    Ankle dorsiflexion    Ankle plantarflexion    Ankle inversion    Ankle eversion     (Blank rows = not tested)  LOWER EXTREMITY MMT:    MMT Right eval Left eval  Hip flexion 5 5  Hip extension    Hip abduction 4 4  Hip adduction    Hip internal rotation    Hip external rotation    Knee flexion 5 5  Knee extension 5 5  Ankle dorsiflexion 5 5  Ankle plantarflexion 5 5  Ankle inversion    Ankle eversion 5 4 pn   (Blank rows = not tested) CERVICAL ROM:   Active ROM A/PROM (deg) eval  Flexion 51 concordant thoracic pain  Extension 50  Right lateral flexion 15 concordant thoracic pain  Left lateral flexion 10 concordant thoracic pain  Right rotation 60  Left  rotation 35 neck pain   (Blank rows = not tested) UPPER EXTREMITY MMT:  MMT Right eval Left eval  Shoulder flexion 5 5  Shoulder extension    Shoulder abduction 5 5  Shoulder adduction    Shoulder extension    Shoulder internal rotation    Shoulder external rotation    Middle trapezius 4 4  Lower trapezius    Elbow flexion 5 5  Elbow extension 5 5  Wrist flexion 5 5  Wrist extension 5 5  Wrist ulnar deviation    Wrist radial deviation    Wrist pronation    Wrist supination    Grip strength     (Blank rows = not tested)  SPECIAL TESTS:  (+) Spurlings (+) Cervical distraction (+) SLR RLE  (-) Hoffman's  (-) Clonus   FUNCTIONAL TESTS:  5 times sit to stand: 18 seconds   GAIT: Distance walked: 20 ft  Assistive device utilized: None Level of assistance: Complete Independence Comments: limited trunk rotation, WBOS  [facility] Adult PT Treatment:                                                DATE: 11/07/22 Evaluation only     PATIENT EDUCATION:  Education details: POC, assessment findings  Person educated: Patient Education method: Explanation Education comprehension: verbalized understanding  HOME EXERCISE PROGRAM: No time to issue   ASSESSMENT:  CLINICAL IMPRESSION: Patient is a 62 y.o. male who was seen today for physical therapy evaluation for multiple areas of pain including the cervical, thoracic, and lumbar region. He reports history of chronic multi-joint pain reporting history of various injuries/fractures to multiple body parts in addition to history of lumbar surgery. His chief complaint of pain is along the Rt mid-thoracic region and low back pain that have progressively worsened. He has concordant Rt mid-thoracic pain with cervical flexion and lateral flexion AROM, palpation of the Rt thoracic paraspinals, and Spurling's test. In regards to his low back pain he has pain and limited range into extension and lateral flexion. He has good strength about the  upper and lower extremities, but weakness about periscapular musculature present. He will benefit from skilled PT to address the above stated deficits in order to optimize his function and return to heavy housework such as lifting, pushing ,and pulling activity.   OBJECTIVE IMPAIRMENTS: Abnormal gait, decreased activity tolerance, decreased endurance, decreased knowledge of condition, difficulty walking, decreased ROM, decreased strength, increased fascial restrictions, improper body mechanics, postural dysfunction, and pain.   ACTIVITY LIMITATIONS: carrying, lifting, bending, sitting, standing, squatting, reach over head, and locomotion level  PARTICIPATION LIMITATIONS: meal prep, cleaning, laundry, community activity, and yard work  PERSONAL FACTORS: Age, Fitness, Time since onset of injury/illness/exacerbation, and 3+ comorbidities: see PMH above  are also affecting patient's functional outcome.   REHAB POTENTIAL: Fair chronicity  CLINICAL DECISION MAKING: Evolving/moderate complexity  EVALUATION COMPLEXITY: Moderate   GOALS: Goals reviewed with patient? Yes  SHORT TERM GOALS: Target date: 12/05/2022    Patient will be independent and compliant with initial HEP.   Baseline: see above Goal status: INITIAL  2.  Patient will demonstrate at least 50 degrees of Lt cervical rotation AROM to improve ability to complete head turns while driving.  Baseline: see above  Goal status: INITIAL  3.  Patient will complete 5 x STS in </= 12 seconds to  improve his functional strength.  Baseline: see above  Goal status: INITIAL  4.  Patient will demonstrate proper lifting mechanics without an increase in pain.  Baseline: no knowledge Goal status: INITIAL  LONG TERM GOALS: Target date: 01/02/2023    Patient will be able to lift at least 45 lbs with proper form without an increase in pain.  Baseline: unable Goal status: INITIAL  2.  Patient will be able to push/pull at least 50 lbs to  improve ability to complete heavy household activities.  Baseline: see above Goal status: INITIAL  3.  Patient will score </= 35% disability on the Modified Oswestry (MCID is 12%) to signify clinically meaningful improvement in functional abilities.   Baseline: see above Goal status: INITIAL  4.  Patient will report pain as </= 3/10 to improve his tolerance to standing and walking activity.  Baseline: see above Goal status: INITIAL  5.  Patient will be independent with advanced home program to assist in management of his chronic pain.  Baseline: see above  Goal status: INITIAL  PLAN:  PT FREQUENCY: 1-2x/week  PT DURATION: 8 weeks  PLANNED INTERVENTIONS: Therapeutic exercises, Therapeutic activity, Neuromuscular re-education, Balance training, Gait training, Patient/Family education, Self Care, Aquatic Therapy, Dry Needling, Electrical stimulation, Cryotherapy, Moist heat, Traction, Manual therapy, and Re-evaluation.  PLAN FOR NEXT SESSION: issue initial HEP to include postural strengthening, trunk mobility, consider TPDN to rhomboid (patient reports history of laminectomy, no TPDN to this area).   Letitia Libra, PT, DPT, ATC 11/07/22 4:28 PM   Check all possible CPT codes: 56433 - PT Re-evaluation, 97110- Therapeutic Exercise, [phone]- Neuro Re-education, [phone] - Gait Training, [phone] - Manual Therapy, 97530 - Therapeutic Activities, 97535 - Self Care, [phone] - Mechanical traction, 97014 - Electrical stimulation (unattended), and [phone] - Aquatic therapy    Check all conditions that are expected to impact treatment: Musculoskeletal disorders   If treatment provided at initial evaluation, no treatment charged due to lack of authorization.

## 2022-11-21 ENCOUNTER — Ambulatory Visit: Payer: Medicaid Other | Admitting: Physical Therapy

## 2022-11-21 ENCOUNTER — Telehealth: Payer: Self-pay | Admitting: Physical Therapy

## 2022-11-21 NOTE — Therapy (Incomplete)
OUTPATIENT PHYSICAL THERAPY TREATMENT NOTE   Patient Name: Evan Barker MRN: [id number] DOB:1961/06/09, 62 y.o., male Today's Date: 11/21/2022   PCP: Ronita Hipps, MD  REFERRING PROVIDER: Marybelle Killings, MD    END OF SESSION:    Past Medical History:  Diagnosis Date   Anxiety    Fracture, skull ([place])    Hernia of abdominal cavity    Shoulder dislocation    Past Surgical History:  Procedure Laterality Date   BACK SURGERY     Patient Active Problem List   Diagnosis Date Noted   Chest pain, rule out acute myocardial infarction 11/10/2018    REFERRING DIAG:  M54.2 (ICD-10-CM) - Neck pain  M54.9 (ICD-10-CM) - Mid back pain  M54.50,G89.29 (ICD-10-CM) - Chronic right-sided low back pain, unspecified whether sciatica present    THERAPY DIAG:  No diagnosis found.  Rationale for Evaluation and Treatment Rehabilitation  PERTINENT HISTORY: See PMH above  PRECAUTIONS: Fall    SUBJECTIVE:      SUBJECTIVE STATEMENT:  ***  PAIN:  Are you having pain? Yes:  NPRS scale: 4 neck/upper back; 6 low back/10 Pain location: neck/upper back; low back Pain description: sharp neck/upper back; dull low back Aggravating factors: neck/upper back rotating the trunk, turning to the Lt; for the low back prolonged sitting, standing,walking Relieving factors: movement   OBJECTIVE: (objective measures completed at initial evaluation unless otherwise dated) PATIENT SURVEYS:  Modified Oswestry 50% disability; 25/50     SENSATION: Diminished sensation to light touch Rt lateral calf     POSTURE:  Rounded shoulders, forward head, and increased thoracic kyphosis   PALPATION: Tautness and palpable tenderness bilateral thoracolumbar paraspinals, Rt middle trap/rhomboids, bilateral upper traps   LUMBAR ROM:    AROM eval  Flexion WNL  Extension 50% limited pn  Right lateral flexion 25% limited pn  Left lateral flexion 25% limited pn  Right rotation WNL  Left rotation WNL   (Blank  rows = not tested)   LOWER EXTREMITY ROM:      Active  Right eval Left eval  Hip flexion      Hip extension      Hip abduction      Hip adduction      Hip internal rotation      Hip external rotation      Knee flexion      Knee extension      Ankle dorsiflexion      Ankle plantarflexion      Ankle inversion      Ankle eversion       (Blank rows = not tested)   LOWER EXTREMITY MMT:     MMT Right eval Left eval  Hip flexion 5 5  Hip extension      Hip abduction 4 4  Hip adduction      Hip internal rotation      Hip external rotation      Knee flexion 5 5  Knee extension 5 5  Ankle dorsiflexion 5 5  Ankle plantarflexion 5 5  Ankle inversion      Ankle eversion 5 4 pn   (Blank rows = not tested)  CERVICAL ROM:    Active ROM A/PROM (deg) eval  Flexion 51 concordant thoracic pain  Extension 50  Right lateral flexion 15 concordant thoracic pain  Left lateral flexion 10 concordant thoracic pain  Right rotation 60  Left rotation 35 neck pain   (Blank rows = not tested)  UPPER EXTREMITY MMT:  MMT Right eval Left eval  Shoulder flexion 5 5  Shoulder extension      Shoulder abduction 5 5  Shoulder adduction      Shoulder extension      Shoulder internal rotation      Shoulder external rotation      Middle trapezius 4 4  Lower trapezius      Elbow flexion 5 5  Elbow extension 5 5  Wrist flexion 5 5  Wrist extension 5 5  Wrist ulnar deviation      Wrist radial deviation      Wrist pronation      Wrist supination      Grip strength       (Blank rows = not tested)   SPECIAL TESTS:  (+) Spurlings (+) Cervical distraction (+) SLR RLE  (-) Hoffman's  (-) Clonus    FUNCTIONAL TESTS:  5 times sit to stand: 18 seconds    GAIT: Distance walked: 20 ft  Assistive device utilized: None Level of assistance: Complete Independence Comments: limited trunk rotation, WBOS    TODAY'S TREATMENT [facility] Adult PT Treatment:                                                 DATE: 11/21/2022 Therapeutic Exercise: ***   PATIENT EDUCATION:  Education details: HEP Person educated: Patient Education method: Explanation Education comprehension: Verbalized understanding   HOME EXERCISE PROGRAM: No time to issue     ASSESSMENT: CLINICAL IMPRESSION: Patient tolerated therapy well with no adverse effects. *** Patient would benefit from continued skilled PT to progress his mobility and strength in order to reduce pain and maximize functional ability.  Patient is a 62 y.o. male who was seen today for physical therapy evaluation for multiple areas of pain including the cervical, thoracic, and lumbar region. He reports history of chronic multi-joint pain reporting history of various injuries/fractures to multiple body parts in addition to history of lumbar surgery. His chief complaint of pain is along the Rt mid-thoracic region and low back pain that have progressively worsened. He has concordant Rt mid-thoracic pain with cervical flexion and lateral flexion AROM, palpation of the Rt thoracic paraspinals, and Spurling's test. In regards to his low back pain he has pain and limited range into extension and lateral flexion. He has good strength about the upper and lower extremities, but weakness about periscapular musculature present. He will benefit from skilled PT to address the above stated deficits in order to optimize his function and return to heavy housework such as lifting, pushing ,and pulling activity.    OBJECTIVE IMPAIRMENTS: Abnormal gait, decreased activity tolerance, decreased endurance, decreased knowledge of condition, difficulty walking, decreased ROM, decreased strength, increased fascial restrictions, improper body mechanics, postural dysfunction, and pain.    ACTIVITY LIMITATIONS: carrying, lifting, bending, sitting, standing, squatting, reach over head, and locomotion level   PARTICIPATION LIMITATIONS: meal prep, cleaning, laundry, community  activity, and yard work   PERSONAL FACTORS: Age, Fitness, Time since onset of injury/illness/exacerbation, and 3+ comorbidities: see PMH above  are also affecting patient's functional outcome.      GOALS: Goals reviewed with patient? Yes   SHORT TERM GOALS: Target date: 12/05/2022   Patient will be independent and compliant with initial HEP.  Baseline: see above Goal status: INITIAL   2.  Patient will demonstrate at least 50 degrees  of Lt cervical rotation AROM to improve ability to complete head turns while driving.  Baseline: see above  Goal status: INITIAL   3.  Patient will complete 5 x STS in </= 12 seconds to improve his functional strength.  Baseline: see above  Goal status: INITIAL   4.  Patient will demonstrate proper lifting mechanics without an increase in pain.  Baseline: no knowledge Goal status: INITIAL   LONG TERM GOALS: Target date: 01/02/2023   Patient will be able to lift at least 45 lbs with proper form without an increase in pain.  Baseline: unable Goal status: INITIAL   2.  Patient will be able to push/pull at least 50 lbs to improve ability to complete heavy household activities.  Baseline: see above Goal status: INITIAL   3.  Patient will score </= 35% disability on the Modified Oswestry (MCID is 12%) to signify clinically meaningful improvement in functional abilities.  Baseline: see above Goal status: INITIAL   4.  Patient will report pain as </= 3/10 to improve his tolerance to standing and walking activity.  Baseline: see above Goal status: INITIAL   5.  Patient will be independent with advanced home program to assist in management of his chronic pain.  Baseline: see above  Goal status: INITIAL    PLAN: PT FREQUENCY: 1-2x/week   PT DURATION: 8 weeks   PLANNED INTERVENTIONS: Therapeutic exercises, Therapeutic activity, Neuromuscular re-education, Balance training, Gait training, Patient/Family education, Self Care, Aquatic Therapy, Dry  Needling, Electrical stimulation, Cryotherapy, Moist heat, Traction, Manual therapy, and Re-evaluation.   PLAN FOR NEXT SESSION: issue initial HEP to include postural strengthening, trunk mobility, consider TPDN to rhomboid (patient reports history of laminectomy, no TPDN to this area).    Hilda Blades, PT, DPT, LAT, ATC 11/21/22  8:21 AM Phone: [phone] Fax: [phone]

## 2022-11-21 NOTE — Telephone Encounter (Signed)
Attempted to contact patient due to missed PT appointment. Left VM informing patient of missed appointment and next scheduled appointment, reminder of attendance policy.   Hilda Blades, PT, DPT, LAT, ATC 11/21/22  3:52 PM Phone: [phone] Fax: [phone]

## 2022-11-22 NOTE — Therapy (Incomplete)
OUTPATIENT PHYSICAL THERAPY TREATMENT NOTE   Patient Name: Evan Barker MRN: [id number] DOB:05/14/1961, 62 y.o., male Today's Date: 11/22/2022   PCP: Ronita Hipps, MD  REFERRING PROVIDER: Marybelle Killings, MD    END OF SESSION:    Past Medical History:  Diagnosis Date   Anxiety    Fracture, skull ([place])    Hernia of abdominal cavity    Shoulder dislocation    Past Surgical History:  Procedure Laterality Date   BACK SURGERY     Patient Active Problem List   Diagnosis Date Noted   Chest pain, rule out acute myocardial infarction 11/10/2018    REFERRING DIAG:  M54.2 (ICD-10-CM) - Neck pain  M54.9 (ICD-10-CM) - Mid back pain  M54.50,G89.29 (ICD-10-CM) - Chronic right-sided low back pain, unspecified whether sciatica present    THERAPY DIAG:  No diagnosis found.  Rationale for Evaluation and Treatment Rehabilitation  PERTINENT HISTORY: See PMH above  PRECAUTIONS: Fall    SUBJECTIVE:      SUBJECTIVE STATEMENT:  ***  PAIN:  Are you having pain? Yes:  NPRS scale: 4 neck/upper back; 6 low back/10 Pain location: neck/upper back; low back Pain description: sharp neck/upper back; dull low back Aggravating factors: neck/upper back rotating the trunk, turning to the Lt; for the low back prolonged sitting, standing,walking Relieving factors: movement   OBJECTIVE: (objective measures completed at initial evaluation unless otherwise dated) PATIENT SURVEYS:  Modified Oswestry 50% disability; 25/50     SENSATION: Diminished sensation to light touch Rt lateral calf     POSTURE:  Rounded shoulders, forward head, and increased thoracic kyphosis   PALPATION: Tautness and palpable tenderness bilateral thoracolumbar paraspinals, Rt middle trap/rhomboids, bilateral upper traps   LUMBAR ROM:    AROM eval  Flexion WNL  Extension 50% limited pn  Right lateral flexion 25% limited pn  Left lateral flexion 25% limited pn  Right rotation WNL  Left rotation WNL   (Blank  rows = not tested)   LOWER EXTREMITY ROM:      Active  Right eval Left eval  Hip flexion      Hip extension      Hip abduction      Hip adduction      Hip internal rotation      Hip external rotation      Knee flexion      Knee extension      Ankle dorsiflexion      Ankle plantarflexion      Ankle inversion      Ankle eversion       (Blank rows = not tested)   LOWER EXTREMITY MMT:     MMT Right eval Left eval  Hip flexion 5 5  Hip extension      Hip abduction 4 4  Hip adduction      Hip internal rotation      Hip external rotation      Knee flexion 5 5  Knee extension 5 5  Ankle dorsiflexion 5 5  Ankle plantarflexion 5 5  Ankle inversion      Ankle eversion 5 4 pn   (Blank rows = not tested)  CERVICAL ROM:    Active ROM A/PROM (deg) eval  Flexion 51 concordant thoracic pain  Extension 50  Right lateral flexion 15 concordant thoracic pain  Left lateral flexion 10 concordant thoracic pain  Right rotation 60  Left rotation 35 neck pain   (Blank rows = not tested)  UPPER EXTREMITY MMT:  MMT Right eval Left eval  Shoulder flexion 5 5  Shoulder extension      Shoulder abduction 5 5  Shoulder adduction      Shoulder extension      Shoulder internal rotation      Shoulder external rotation      Middle trapezius 4 4  Lower trapezius      Elbow flexion 5 5  Elbow extension 5 5  Wrist flexion 5 5  Wrist extension 5 5  Wrist ulnar deviation      Wrist radial deviation      Wrist pronation      Wrist supination      Grip strength       (Blank rows = not tested)   SPECIAL TESTS:  (+) Spurlings (+) Cervical distraction (+) SLR RLE  (-) Hoffman's  (-) Clonus    FUNCTIONAL TESTS:  5 times sit to stand: 18 seconds    GAIT: Distance walked: 20 ft  Assistive device utilized: None Level of assistance: Complete Independence Comments: limited trunk rotation, WBOS    TODAY'S TREATMENT [facility] Adult PT Treatment:                                                 DATE: 11/21/2022 Therapeutic Exercise: ***   PATIENT EDUCATION:  Education details: HEP Person educated: Patient Education method: Explanation Education comprehension: Verbalized understanding   HOME EXERCISE PROGRAM: No time to issue     ASSESSMENT: CLINICAL IMPRESSION: Patient tolerated therapy well with no adverse effects. *** Patient would benefit from continued skilled PT to progress his mobility and strength in order to reduce pain and maximize functional ability.  Patient is a 62 y.o. male who was seen today for physical therapy evaluation for multiple areas of pain including the cervical, thoracic, and lumbar region. He reports history of chronic multi-joint pain reporting history of various injuries/fractures to multiple body parts in addition to history of lumbar surgery. His chief complaint of pain is along the Rt mid-thoracic region and low back pain that have progressively worsened. He has concordant Rt mid-thoracic pain with cervical flexion and lateral flexion AROM, palpation of the Rt thoracic paraspinals, and Spurling's test. In regards to his low back pain he has pain and limited range into extension and lateral flexion. He has good strength about the upper and lower extremities, but weakness about periscapular musculature present. He will benefit from skilled PT to address the above stated deficits in order to optimize his function and return to heavy housework such as lifting, pushing ,and pulling activity.    OBJECTIVE IMPAIRMENTS: Abnormal gait, decreased activity tolerance, decreased endurance, decreased knowledge of condition, difficulty walking, decreased ROM, decreased strength, increased fascial restrictions, improper body mechanics, postural dysfunction, and pain.    ACTIVITY LIMITATIONS: carrying, lifting, bending, sitting, standing, squatting, reach over head, and locomotion level   PARTICIPATION LIMITATIONS: meal prep, cleaning, laundry, community  activity, and yard work   PERSONAL FACTORS: Age, Fitness, Time since onset of injury/illness/exacerbation, and 3+ comorbidities: see PMH above  are also affecting patient's functional outcome.      GOALS: Goals reviewed with patient? Yes   SHORT TERM GOALS: Target date: 12/05/2022   Patient will be independent and compliant with initial HEP.  Baseline: see above Goal status: INITIAL   2.  Patient will demonstrate at least 50 degrees  of Lt cervical rotation AROM to improve ability to complete head turns while driving.  Baseline: see above  Goal status: INITIAL   3.  Patient will complete 5 x STS in </= 12 seconds to improve his functional strength.  Baseline: see above  Goal status: INITIAL   4.  Patient will demonstrate proper lifting mechanics without an increase in pain.  Baseline: no knowledge Goal status: INITIAL   LONG TERM GOALS: Target date: 01/02/2023   Patient will be able to lift at least 45 lbs with proper form without an increase in pain.  Baseline: unable Goal status: INITIAL   2.  Patient will be able to push/pull at least 50 lbs to improve ability to complete heavy household activities.  Baseline: see above Goal status: INITIAL   3.  Patient will score </= 35% disability on the Modified Oswestry (MCID is 12%) to signify clinically meaningful improvement in functional abilities.  Baseline: see above Goal status: INITIAL   4.  Patient will report pain as </= 3/10 to improve his tolerance to standing and walking activity.  Baseline: see above Goal status: INITIAL   5.  Patient will be independent with advanced home program to assist in management of his chronic pain.  Baseline: see above  Goal status: INITIAL    PLAN: PT FREQUENCY: 1-2x/week   PT DURATION: 8 weeks   PLANNED INTERVENTIONS: Therapeutic exercises, Therapeutic activity, Neuromuscular re-education, Balance training, Gait training, Patient/Family education, Self Care, Aquatic Therapy, Dry  Needling, Electrical stimulation, Cryotherapy, Moist heat, Traction, Manual therapy, and Re-evaluation.   PLAN FOR NEXT SESSION: issue initial HEP to include postural strengthening, trunk mobility, consider TPDN to rhomboid (patient reports history of laminectomy, no TPDN to this area).    Hilda Blades, PT, DPT, LAT, ATC 11/22/22  8:13 AM Phone: [phone] Fax: [phone]

## 2022-11-23 ENCOUNTER — Ambulatory Visit: Payer: Medicaid Other | Admitting: Physical Therapy

## 2022-11-23 NOTE — Therapy (Signed)
OUTPATIENT PHYSICAL THERAPY TREATMENT NOTE   Patient Name: Evan Barker MRN: [id number] DOB:1960/11/22, 62 y.o., male Today's Date: 11/26/2022   PCP: Ronita Hipps, MD  REFERRING PROVIDER: Marybelle Killings, MD    END OF SESSION:   PT End of Session - 11/26/22 0854     Visit Number 2    Number of Visits 17    Date for PT Re-Evaluation 01/05/23    Authorization Type [employer]arda Amber complete    Authorization Time Period 11/21/2022 - 01/05/2023    Authorization - Visit Number 1    Authorization - Number of Visits 12    PT Start Time 0847    PT Stop Time 0930    PT Time Calculation (min) 43 min    Activity Tolerance Patient tolerated treatment well    Behavior During Therapy [facility] for tasks assessed/performed             Past Medical History:  Diagnosis Date   Anxiety    Fracture, skull ([place])    Hernia of abdominal cavity    Shoulder dislocation    Past Surgical History:  Procedure Laterality Date   BACK SURGERY     Patient Active Problem List   Diagnosis Date Noted   Chest pain, rule out acute myocardial infarction 11/10/2018    REFERRING DIAG:  M54.2 (ICD-10-CM) - Neck pain  M54.9 (ICD-10-CM) - Mid back pain  M54.50,G89.29 (ICD-10-CM) - Chronic right-sided low back pain, unspecified whether sciatica present    THERAPY DIAG:  Pain in thoracic spine  Cervicalgia  Other low back pain  Abnormal posture  Muscle weakness (generalized)  Rationale for Evaluation and Treatment Rehabilitation  PERTINENT HISTORY: See PMH above  PRECAUTIONS: Fall    SUBJECTIVE:      SUBJECTIVE STATEMENT:  Patient reports he was sick last weak and couldn't come in. He states his pain is still the same, when it occurs it seems to be sharp and shoots around the rib cage.   PAIN:  Are you having pain? Yes:  NPRS scale: 4 neck/upper back; 6 low back/10 Pain location: neck/upper back; low back Pain description: sharp neck/upper back; dull low back Aggravating factors: neck/upper  back rotating the trunk, turning to the Lt; for the low back prolonged sitting, standing,walking Relieving factors: movement   OBJECTIVE: (objective measures completed at initial evaluation unless otherwise dated) PATIENT SURVEYS:  Modified Oswestry 50% disability; 25/50     SENSATION: Diminished sensation to light touch Rt lateral calf     POSTURE:  Rounded shoulders, forward head, and increased thoracic kyphosis   PALPATION: Tautness and palpable tenderness bilateral thoracolumbar paraspinals, Rt middle trap/rhomboids, bilateral upper traps   LUMBAR ROM:    AROM eval 11/26/22  Flexion WNL   Extension 50% limited pn 50%  Right lateral flexion 25% limited pn 25%  Left lateral flexion 25% limited pn 25%  Right rotation WNL   Left rotation WNL    (Blank rows = not tested)  Pain reported with all lumbar motion, movement is hesitant and jerky   LOWER EXTREMITY ROM:      Active  Right eval Left eval  Hip flexion      Hip extension      Hip abduction      Hip adduction      Hip internal rotation      Hip external rotation      Knee flexion      Knee extension      Ankle dorsiflexion  Ankle plantarflexion      Ankle inversion      Ankle eversion       (Blank rows = not tested)   LOWER EXTREMITY MMT:     MMT Right eval Left eval  Hip flexion 5 5  Hip extension      Hip abduction 4 4  Hip adduction      Hip internal rotation      Hip external rotation      Knee flexion 5 5  Knee extension 5 5  Ankle dorsiflexion 5 5  Ankle plantarflexion 5 5  Ankle inversion      Ankle eversion 5 4 pn   (Blank rows = not tested)  CERVICAL ROM:    Active ROM A/PROM (deg) eval  Flexion 51 concordant thoracic pain  Extension 50  Right lateral flexion 15 concordant thoracic pain  Left lateral flexion 10 concordant thoracic pain  Right rotation 60  Left rotation 35 neck pain   (Blank rows = not tested)  UPPER EXTREMITY MMT:   MMT Right eval Left eval  Shoulder  flexion 5 5  Shoulder extension      Shoulder abduction 5 5  Shoulder adduction      Shoulder extension      Shoulder internal rotation      Shoulder external rotation      Middle trapezius 4 4  Lower trapezius      Elbow flexion 5 5  Elbow extension 5 5  Wrist flexion 5 5  Wrist extension 5 5  Wrist ulnar deviation      Wrist radial deviation      Wrist pronation      Wrist supination      Grip strength       (Blank rows = not tested)   SPECIAL TESTS:  (+) Spurlings (+) Cervical distraction (+) SLR RLE  (-) Hoffman's  (-) Clonus    FUNCTIONAL TESTS:  5 times sit to stand: 18 seconds    GAIT: Distance walked: 20 ft  Assistive device utilized: None Level of assistance: Complete Independence Comments: limited trunk rotation, WBOS    TODAY'S TREATMENT [facility] Adult PT Treatment:                                                DATE: 11/26/2022 Therapeutic Exercise: UBE L1 x 4 min (fwd/bwd) while taking subjective LTR x 3 min Supine SKTC 2 x 20 sec each Supine piriformis stretch 2 x 20 sec Hooklying clamshell with green 2 x 10 SLR with abdominal activation 2 x 10 each Sidelying thoracic rotation x 10 each Row with blue 2 x 10   PATIENT EDUCATION:  Education details: HEP Person educated: Patient Education method: Explanation, Handout Education comprehension: Verbalized understanding   HOME EXERCISE PROGRAM: Access Code [email or web address]    ASSESSMENT: CLINICAL IMPRESSION: Patient tolerated therapy well with no adverse effects. He does report pain with all exercises but he was able to complete and provided HEP. Therapy focused on spinal mobility and core/postural exercises. He continues to demonstrate limitations in spinal motion and reports sharp pains with movement. Patient would benefit from continued skilled PT to progress his mobility and strength in order to reduce pain and maximize functional ability.    OBJECTIVE IMPAIRMENTS: Abnormal gait, decreased activity  tolerance, decreased endurance, decreased knowledge of condition, difficulty walking, decreased ROM,  decreased strength, increased fascial restrictions, improper body mechanics, postural dysfunction, and pain.    ACTIVITY LIMITATIONS: carrying, lifting, bending, sitting, standing, squatting, reach over head, and locomotion level   PARTICIPATION LIMITATIONS: meal prep, cleaning, laundry, community activity, and yard work   PERSONAL FACTORS: Age, Fitness, Time since onset of injury/illness/exacerbation, and 3+ comorbidities: see PMH above  are also affecting patient's functional outcome.      GOALS: Goals reviewed with patient? Yes   SHORT TERM GOALS: Target date: 12/05/2022   Patient will be independent and compliant with initial HEP.  Baseline: see above Goal status: INITIAL   2.  Patient will demonstrate at least 50 degrees of Lt cervical rotation AROM to improve ability to complete head turns while driving.  Baseline: see above  Goal status: INITIAL   3.  Patient will complete 5 x STS in </= 12 seconds to improve his functional strength.  Baseline: see above  Goal status: INITIAL   4.  Patient will demonstrate proper lifting mechanics without an increase in pain.  Baseline: no knowledge Goal status: INITIAL   LONG TERM GOALS: Target date: 01/02/2023   Patient will be able to lift at least 45 lbs with proper form without an increase in pain.  Baseline: unable Goal status: INITIAL   2.  Patient will be able to push/pull at least 50 lbs to improve ability to complete heavy household activities.  Baseline: see above Goal status: INITIAL   3.  Patient will score </= 35% disability on the Modified Oswestry (MCID is 12%) to signify clinically meaningful improvement in functional abilities.  Baseline: see above Goal status: INITIAL   4.  Patient will report pain as </= 3/10 to improve his tolerance to standing and walking activity.  Baseline: see above Goal status: INITIAL   5.   Patient will be independent with advanced home program to assist in management of his chronic pain.  Baseline: see above  Goal status: INITIAL    PLAN: PT FREQUENCY: 1-2x/week   PT DURATION: 8 weeks   PLANNED INTERVENTIONS: Therapeutic exercises, Therapeutic activity, Neuromuscular re-education, Balance training, Gait training, Patient/Family education, Self Care, Aquatic Therapy, Dry Needling, Electrical stimulation, Cryotherapy, Moist heat, Traction, Manual therapy, and Re-evaluation.   PLAN FOR NEXT SESSION: issue initial HEP to include postural strengthening, trunk mobility, consider TPDN to rhomboid (patient reports history of laminectomy, no TPDN to this area).    Hilda Blades, PT, DPT, LAT, ATC 11/26/22  10:01 AM Phone: [phone] Fax: [phone]

## 2022-11-26 ENCOUNTER — Telehealth: Payer: Self-pay | Admitting: Physical Therapy

## 2022-11-26 ENCOUNTER — Encounter: Payer: Self-pay | Admitting: Physical Therapy

## 2022-11-26 ENCOUNTER — Other Ambulatory Visit: Payer: Self-pay

## 2022-11-26 ENCOUNTER — Ambulatory Visit: Payer: Medicaid Other | Admitting: Physical Therapy

## 2022-11-26 DIAGNOSIS — M542 Cervicalgia: Secondary | ICD-10-CM

## 2022-11-26 DIAGNOSIS — M546 Pain in thoracic spine: Secondary | ICD-10-CM

## 2022-11-26 DIAGNOSIS — M6281 Muscle weakness (generalized): Secondary | ICD-10-CM

## 2022-11-26 DIAGNOSIS — M5459 Other low back pain: Secondary | ICD-10-CM

## 2022-11-26 DIAGNOSIS — R293 Abnormal posture: Secondary | ICD-10-CM

## 2022-11-26 NOTE — Patient Instructions (Signed)
Access Code: [email or web address] URL: [email or web address] Date: 11/26/2022 Prepared by: Hilda Blades  Exercises - Supine Lower Trunk Rotation  - 2 x daily - 10 reps - Hooklying Single Knee to Chest Stretch  - 2 x daily - 2 reps - 20 seconds hold - Supine Piriformis Stretch with Foot on Ground  - 2 x daily - 2 reps - 20 seconds hold - Hooklying Clamshell with Resistance  - 2 x daily - 2 sets - 10 reps - Straight Leg Raise  - 2 x daily - 2 sets - 10 reps - Sidelying Mid Thoracic Rotation  - 2 x daily - 10 reps - Standing Row with Anchored Resistance  - 2 x daily - 2 sets - 10 reps

## 2022-11-26 NOTE — Telephone Encounter (Signed)
Attempted to contact patient on 11/23/2022, no answer and left VM informing patient of consecutive missed appointments resulting in cancellation of all future appointments except next scheduled on 11/26/2022. He would be allowed to schedule 1 visit at a time moving forward per attendance policy.   Hilda Blades, PT, DPT, LAT, ATC 11/26/22  7:58 AM Phone: [phone] Fax: [phone]

## 2022-11-27 ENCOUNTER — Ambulatory Visit: Payer: Medicaid Other | Admitting: Physical Therapy

## 2022-12-03 ENCOUNTER — Ambulatory Visit: Payer: Medicaid Other | Attending: Orthopaedic Surgery

## 2022-12-03 ENCOUNTER — Ambulatory Visit: Payer: Medicaid Other

## 2022-12-03 DIAGNOSIS — M546 Pain in thoracic spine: Secondary | ICD-10-CM | POA: Diagnosis not present

## 2022-12-03 DIAGNOSIS — R293 Abnormal posture: Secondary | ICD-10-CM

## 2022-12-03 DIAGNOSIS — M542 Cervicalgia: Secondary | ICD-10-CM | POA: Diagnosis present

## 2022-12-03 DIAGNOSIS — M6281 Muscle weakness (generalized): Secondary | ICD-10-CM | POA: Diagnosis present

## 2022-12-03 DIAGNOSIS — M5459 Other low back pain: Secondary | ICD-10-CM | POA: Diagnosis present

## 2022-12-03 NOTE — Therapy (Signed)
OUTPATIENT PHYSICAL THERAPY TREATMENT NOTE   Patient Name: Evan Barker MRN: [id number] DOB:05/14/1961, 62 y.o., male Today's Date: 12/03/2022   PCP: Ronita Hipps, MD  REFERRING PROVIDER: Marybelle Killings, MD    END OF SESSION:   PT End of Session - 12/03/22 1019     Visit Number 3    Number of Visits 17    Date for PT Re-Evaluation 01/05/23    Authorization Type MCD Narda Amber complete    Authorization Time Period 11/21/2022 - 01/05/2023    Authorization - Visit Number 2    Authorization - Number of Visits 12    PT Start Time 1019    PT Stop Time 1057    PT Time Calculation (min) 38 min    Activity Tolerance --    Behavior During Therapy --              Past Medical History:  Diagnosis Date   Anxiety    Fracture, skull ([place])    Hernia of abdominal cavity    Shoulder dislocation    Past Surgical History:  Procedure Laterality Date   BACK SURGERY     Patient Active Problem List   Diagnosis Date Noted   Chest pain, rule out acute myocardial infarction 11/10/2018    REFERRING DIAG:  M54.2 (ICD-10-CM) - Neck pain  M54.9 (ICD-10-CM) - Mid back pain  M54.50,G89.29 (ICD-10-CM) - Chronic right-sided low back pain, unspecified whether sciatica present    THERAPY DIAG:  Pain in thoracic spine  Cervicalgia  Other low back pain  Abnormal posture  Muscle weakness (generalized)  Rationale for Evaluation and Treatment Rehabilitation  PERTINENT HISTORY: See PMH above  PRECAUTIONS: Fall    SUBJECTIVE:      SUBJECTIVE STATEMENT:  Patient reports he is feeling the same. He reports compliance with HEP and has been walking.   PAIN:  Are you having pain? Yes:  NPRS scale: 4 neck/upper back; 7 low back/10 Pain location: neck/upper back; low back Pain description: sharp neck/upper back; dull low back Aggravating factors: neck/upper back rotating the trunk, turning to the Lt; for the low back prolonged sitting, standing,walking Relieving factors:  movement   OBJECTIVE: (objective measures completed at initial evaluation unless otherwise dated) PATIENT SURVEYS:  Modified Oswestry 50% disability; 25/50     SENSATION: Diminished sensation to light touch Rt lateral calf     POSTURE:  Rounded shoulders, forward head, and increased thoracic kyphosis   PALPATION: Tautness and palpable tenderness bilateral thoracolumbar paraspinals, Rt middle trap/rhomboids, bilateral upper traps   LUMBAR ROM:    AROM eval 11/26/22  Flexion WNL   Extension 50% limited pn 50%  Right lateral flexion 25% limited pn 25%  Left lateral flexion 25% limited pn 25%  Right rotation WNL   Left rotation WNL    (Blank rows = not tested)  Pain reported with all lumbar motion, movement is hesitant and jerky   LOWER EXTREMITY ROM:      Active  Right eval Left eval  Hip flexion      Hip extension      Hip abduction      Hip adduction      Hip internal rotation      Hip external rotation      Knee flexion      Knee extension      Ankle dorsiflexion      Ankle plantarflexion      Ankle inversion      Ankle eversion       (  Blank rows = not tested)   LOWER EXTREMITY MMT:     MMT Right eval Left eval  Hip flexion 5 5  Hip extension      Hip abduction 4 4  Hip adduction      Hip internal rotation      Hip external rotation      Knee flexion 5 5  Knee extension 5 5  Ankle dorsiflexion 5 5  Ankle plantarflexion 5 5  Ankle inversion      Ankle eversion 5 4 pn   (Blank rows = not tested)  CERVICAL ROM:    Active ROM A/PROM (deg) eval 12/03/22  Flexion 51 concordant thoracic pain 56 concordant thoracic pain  Extension 50   Right lateral flexion 15 concordant thoracic pain   Left lateral flexion 10 concordant thoracic pain   Right rotation 60   Left rotation 35 neck pain 50 neck pain   (Blank rows = not tested)  UPPER EXTREMITY MMT:   MMT Right eval Left eval  Shoulder flexion 5 5  Shoulder extension      Shoulder abduction 5 5   Shoulder adduction      Shoulder extension      Shoulder internal rotation      Shoulder external rotation      Middle trapezius 4 4  Lower trapezius      Elbow flexion 5 5  Elbow extension 5 5  Wrist flexion 5 5  Wrist extension 5 5  Wrist ulnar deviation      Wrist radial deviation      Wrist pronation      Wrist supination      Grip strength       (Blank rows = not tested)   SPECIAL TESTS:  (+) Spurlings (+) Cervical distraction (+) SLR RLE  (-) Hoffman's  (-) Clonus    FUNCTIONAL TESTS:  5 times sit to stand: 18 seconds    GAIT: Distance walked: 20 ft  Assistive device utilized: None Level of assistance: Complete Independence Comments: limited trunk rotation, WBOS    TODAY'S TREATMENT [facility] Adult PT Treatment:                                                DATE: 12/03/22 Therapeutic Exercise: NuStep level 5 x 5 minutes UE/LE  LTR with figure 4 x 1 minute each  Supine SKTC x 5 each; 5 sec hold  Sidelying thoracic rotation x 5 Lt; d/c on Rt due to pain Stability ball rollout x 2 minutes  Supine resisted horizontal shoulder abduction red band 2 x 10  Hip bridge x 5 d/c due to pain Supine pelvic tilt x 5 d/c due to pain  Supine diaphragmatic breathing x 10    [facility] Adult PT Treatment:                                                DATE: 11/26/2022 Therapeutic Exercise: UBE L1 x 4 min (fwd/bwd) while taking subjective LTR x 3 min Supine SKTC 2 x 20 sec each Supine piriformis stretch 2 x 20 sec Hooklying clamshell with green 2 x 10 SLR with abdominal activation 2 x 10 each Sidelying thoracic rotation x 10 each Row  with blue 2 x 10   PATIENT EDUCATION:  Education details: HEP Person educated: Patient Education method: Explanation Education comprehension: Verbalized understanding   HOME EXERCISE PROGRAM: Access Code [id number]    ASSESSMENT: CLINICAL IMPRESSION: Patient demonstrates slight improvement in cervical flexion AROM, but continues to endorse  Rt-sided thoracic pain with this motion. His left cervical rotation AROM has significantly improved compared to evaluation, but continues to endorse cervical pain at end range. Reviewed HEP with patient unable to tolerate Rt sidelying positioning secondary to pain, so was recommended to complete sidelying rotation on the Lt-side only. Continued with trunk mobility and postural/core strengthening with fair tolerance as patient continued to endorse back pain throughout session with occasional reports of sharp Rt-sided thoracic pain most notable with positional changes. He had one episode of feeling nauseous stating this happens occasionally when he has pain. His nausea resolved after brief seated rest break. He required frequent cues to breathe with all exercises.     OBJECTIVE IMPAIRMENTS: Abnormal gait, decreased activity tolerance, decreased endurance, decreased knowledge of condition, difficulty walking, decreased ROM, decreased strength, increased fascial restrictions, improper body mechanics, postural dysfunction, and pain.    ACTIVITY LIMITATIONS: carrying, lifting, bending, sitting, standing, squatting, reach over head, and locomotion level   PARTICIPATION LIMITATIONS: meal prep, cleaning, laundry, community activity, and yard work   PERSONAL FACTORS: Age, Fitness, Time since onset of injury/illness/exacerbation, and 3+ comorbidities: see PMH above  are also affecting patient's functional outcome.      GOALS: Goals reviewed with patient? Yes   SHORT TERM GOALS: Target date: 12/05/2022   Patient will be independent and compliant with initial HEP.  Baseline: see above Goal status: INITIAL   2.  Patient will demonstrate at least 50 degrees of Lt cervical rotation AROM to improve ability to complete head turns while driving.  Baseline: see above  Goal status: met   3.  Patient will complete 5 x STS in </= 12 seconds to improve his functional strength.  Baseline: see above  Goal status:  INITIAL   4.  Patient will demonstrate proper lifting mechanics without an increase in pain.  Baseline: no knowledge Goal status: INITIAL   LONG TERM GOALS: Target date: 01/02/2023   Patient will be able to lift at least 45 lbs with proper form without an increase in pain.  Baseline: unable Goal status: INITIAL   2.  Patient will be able to push/pull at least 50 lbs to improve ability to complete heavy household activities.  Baseline: see above Goal status: INITIAL   3.  Patient will score </= 35% disability on the Modified Oswestry (MCID is 12%) to signify clinically meaningful improvement in functional abilities.  Baseline: see above Goal status: INITIAL   4.  Patient will report pain as </= 3/10 to improve his tolerance to standing and walking activity.  Baseline: see above Goal status: INITIAL   5.  Patient will be independent with advanced home program to assist in management of his chronic pain.  Baseline: see above  Goal status: INITIAL    PLAN: PT FREQUENCY: 1-2x/week   PT DURATION: 8 weeks   PLANNED INTERVENTIONS: Therapeutic exercises, Therapeutic activity, Neuromuscular re-education, Balance training, Gait training, Patient/Family education, Self Care, Aquatic Therapy, Dry Needling, Electrical stimulation, Cryotherapy, Moist heat, Traction, Manual therapy, and Re-evaluation.   PLAN FOR NEXT SESSION:  trunk mobility, core/postural strengthening   Gwendolyn Grant, PT, DPT, ATC 12/03/22 10:59 AM

## 2022-12-05 ENCOUNTER — Ambulatory Visit: Payer: Medicaid Other | Admitting: Physical Therapy

## 2022-12-06 ENCOUNTER — Ambulatory Visit: Payer: Medicaid Other | Admitting: Physical Therapy

## 2022-12-06 ENCOUNTER — Other Ambulatory Visit: Payer: Self-pay

## 2022-12-06 ENCOUNTER — Encounter: Payer: Self-pay | Admitting: Physical Therapy

## 2022-12-06 DIAGNOSIS — M546 Pain in thoracic spine: Secondary | ICD-10-CM | POA: Diagnosis not present

## 2022-12-06 DIAGNOSIS — R293 Abnormal posture: Secondary | ICD-10-CM

## 2022-12-06 DIAGNOSIS — M6281 Muscle weakness (generalized): Secondary | ICD-10-CM

## 2022-12-06 DIAGNOSIS — M5459 Other low back pain: Secondary | ICD-10-CM

## 2022-12-06 DIAGNOSIS — M542 Cervicalgia: Secondary | ICD-10-CM

## 2022-12-06 NOTE — Therapy (Addendum)
OUTPATIENT PHYSICAL THERAPY TREATMENT NOTE  DISCHARGE   Patient Name: Evan Barker MRN: [id number] DOB:02/04/1961, 62 y.o., male Today's Date: 12/06/2022   PCP: Marylen Ponto, MD  REFERRING PROVIDER: Eldred Manges, MD    END OF SESSION:   PT End of Session - 12/06/22 1319     Visit Number 4    Number of Visits 17    Date for PT Re-Evaluation 01/05/23    Authorization Type MCD Robbie Lis complete    Authorization Time Period 11/21/2022 - 01/05/2023    Authorization - Visit Number 3    Authorization - Number of Visits 12    PT Start Time 1313    PT Stop Time 1353    PT Time Calculation (min) 40 min    Activity Tolerance Patient limited by pain    Behavior During Therapy [facility] for tasks assessed/performed               Past Medical History:  Diagnosis Date   Anxiety    Fracture, skull ([facility])    Hernia of abdominal cavity    Shoulder dislocation    Past Surgical History:  Procedure Laterality Date   BACK SURGERY     Patient Active Problem List   Diagnosis Date Noted   Chest pain, rule out acute myocardial infarction 11/10/2018    REFERRING DIAG:  M54.2 (ICD-10-CM) - Neck pain  M54.9 (ICD-10-CM) - Mid back pain  M54.50,G89.29 (ICD-10-CM) - Chronic right-sided low back pain, unspecified whether sciatica present    THERAPY DIAG:  Pain in thoracic spine  Cervicalgia  Other low back pain  Abnormal posture  Muscle weakness (generalized)  Rationale for Evaluation and Treatment Rehabilitation  PERTINENT HISTORY: See PMH above  PRECAUTIONS: Fall    SUBJECTIVE:      SUBJECTIVE STATEMENT:  Patient reports he continues to have the same pain. He has been working on the exercises and he does have pain when he does them. Pain is in the entire back, wraps around the right ribs but feels deeper than rib cage. Also has pain that shoots from right butt cheek down right leg.  PAIN:  Are you having pain? Yes:  NPRS scale: 6 neck/upper back; 7 low back/10 Pain  location: neck/upper back; low back Pain description: sharp neck/upper back; dull low back Aggravating factors: neck/upper back rotating the trunk, turning to the Lt; for the low back prolonged sitting, standing,walking Relieving factors: movement   OBJECTIVE: (objective measures completed at initial evaluation unless otherwise dated) PATIENT SURVEYS:  Modified Oswestry 50% disability; 25/50     SENSATION: Diminished sensation to light touch Rt lateral calf     POSTURE:  Rounded shoulders, forward head, and increased thoracic kyphosis   PALPATION: Tautness and palpable tenderness bilateral thoracolumbar paraspinals, Rt middle trap/rhomboids, bilateral upper traps   LUMBAR ROM:    AROM eval 11/26/22  Flexion WNL   Extension 50% limited pn 50%  Right lateral flexion 25% limited pn 25%  Left lateral flexion 25% limited pn 25%  Right rotation WNL   Left rotation WNL    (Blank rows = not tested)  Pain reported with all lumbar motion, movement is hesitant and jerky   LOWER EXTREMITY ROM:      Active  Right eval Left eval  Hip flexion      Hip extension      Hip abduction      Hip adduction      Hip internal rotation      Hip  external rotation      Knee flexion      Knee extension      Ankle dorsiflexion      Ankle plantarflexion      Ankle inversion      Ankle eversion       (Blank rows = not tested)   LOWER EXTREMITY MMT:     MMT Right eval Left eval  Hip flexion 5 5  Hip extension      Hip abduction 4 4  Hip adduction      Hip internal rotation      Hip external rotation      Knee flexion 5 5  Knee extension 5 5  Ankle dorsiflexion 5 5  Ankle plantarflexion 5 5  Ankle inversion      Ankle eversion 5 4 pn   (Blank rows = not tested)  CERVICAL ROM:    Active ROM A/PROM (deg) eval 12/03/22  Flexion 51 concordant thoracic pain 56 concordant thoracic pain  Extension 50   Right lateral flexion 15 concordant thoracic pain   Left lateral flexion 10  concordant thoracic pain   Right rotation 60   Left rotation 35 neck pain 50 neck pain   (Blank rows = not tested)  UPPER EXTREMITY MMT:   MMT Right eval Left eval  Shoulder flexion 5 5  Shoulder extension      Shoulder abduction 5 5  Shoulder adduction      Shoulder extension      Shoulder internal rotation      Shoulder external rotation      Middle trapezius 4 4  Lower trapezius      Elbow flexion 5 5  Elbow extension 5 5  Wrist flexion 5 5  Wrist extension 5 5  Wrist ulnar deviation      Wrist radial deviation      Wrist pronation      Wrist supination      Grip strength       (Blank rows = not tested)   SPECIAL TESTS:  (+) Spurlings (+) Cervical distraction (+) SLR RLE  (-) Hoffman's  (-) Clonus    FUNCTIONAL TESTS:  5 times sit to stand: 18 seconds    GAIT: Distance walked: 20 ft  Assistive device utilized: None Level of assistance: Complete Independence Comments: limited trunk rotation, WBOS    TODAY'S TREATMENT [facility] Adult PT Treatment:                                                DATE: 12/06/22 Therapeutic Exercise: NuStep L6 x 5 minutes with UE/LE while taking subjective - LE only for last 3 minutes due to back pain Seated hamstring stretch 5 x 15 sec each Seated physioball rollout x 2 min LTR x 2 min Supine SKTC holding behind thigh 4 x 10 sec each SLR 2 x 5 each Supine clamshell with green x 10 Supine shoulder horizontal abduction with green x 10 Doorway pec stretch 5 x 15 sec Counter step back stretch 5 x 10 sec Seated shoulder horizontal abduction with green x 10 Seated clamshell with green x 10 Standing abdominal set with physioball press 2 x 5   [facility] Adult PT Treatment:  DATE: 12/03/22 Therapeutic Exercise: NuStep level 5 x 5 minutes UE/LE  LTR with figure 4 x 1 minute each  Supine SKTC x 5 each; 5 sec hold  Sidelying thoracic rotation x 5 Lt; d/c on Rt due to pain Stability ball rollout  x 2 minutes  Supine resisted horizontal shoulder abduction red band 2 x 10  Hip bridge x 5 d/c due to pain Supine pelvic tilt x 5 d/c due to pain  Supine diaphragmatic breathing x 10   [facility] Adult PT Treatment:                                                DATE: 11/26/2022 Therapeutic Exercise: UBE L1 x 4 min (fwd/bwd) while taking subjective LTR x 3 min Supine SKTC 2 x 20 sec each Supine piriformis stretch 2 x 20 sec Hooklying clamshell with green 2 x 10 SLR with abdominal activation 2 x 10 each Sidelying thoracic rotation x 10 each Row with blue 2 x 10   PATIENT EDUCATION:  Education details: HEP Person educated: Patient Education method: Explanation Education comprehension: Verbalized understanding   HOME EXERCISE PROGRAM: Access Code [email or web address]    ASSESSMENT: CLINICAL IMPRESSION: Patient with fair tolerance for therapy this visit, no adverse effects reported. He continues to report high levels of pain and has increase in pain with all exercise especially on the right side. Pain is globally throughout the back but reports mainly around right shoulder blade that wraps around rib cage and also right buttock down right leg. Therapy continues to focus on progressing mobility, postural and core strength, and flexibility. He is only able to do short rep counts due to pain. No changes were made to his HEP this visit. Patient would benefit from continued skilled PT to progress his mobility and strength in order to reduce pain and maximize functional ability.     OBJECTIVE IMPAIRMENTS: Abnormal gait, decreased activity tolerance, decreased endurance, decreased knowledge of condition, difficulty walking, decreased ROM, decreased strength, increased fascial restrictions, improper body mechanics, postural dysfunction, and pain.    ACTIVITY LIMITATIONS: carrying, lifting, bending, sitting, standing, squatting, reach over head, and locomotion level   PARTICIPATION LIMITATIONS: meal prep,  cleaning, laundry, community activity, and yard work   PERSONAL FACTORS: Age, Fitness, Time since onset of injury/illness/exacerbation, and 3+ comorbidities: see PMH above  are also affecting patient's functional outcome.      GOALS: Goals reviewed with patient? Yes   SHORT TERM GOALS: Target date: 12/05/2022   Patient will be independent and compliant with initial HEP.  Baseline: see above Goal status: INITIAL   2.  Patient will demonstrate at least 50 degrees of Lt cervical rotation AROM to improve ability to complete head turns while driving.  Baseline: see above  Goal status: met   3.  Patient will complete 5 x STS in </= 12 seconds to improve his functional strength.  Baseline: see above  Goal status: INITIAL   4.  Patient will demonstrate proper lifting mechanics without an increase in pain.  Baseline: no knowledge Goal status: INITIAL   LONG TERM GOALS: Target date: 01/02/2023   Patient will be able to lift at least 45 lbs with proper form without an increase in pain.  Baseline: unable Goal status: INITIAL   2.  Patient will be able to push/pull at least 50 lbs to improve ability to  complete heavy household activities.  Baseline: see above Goal status: INITIAL   3.  Patient will score </= 35% disability on the Modified Oswestry (MCID is 12%) to signify clinically meaningful improvement in functional abilities.  Baseline: see above Goal status: INITIAL   4.  Patient will report pain as </= 3/10 to improve his tolerance to standing and walking activity.  Baseline: see above Goal status: INITIAL   5.  Patient will be independent with advanced home program to assist in management of his chronic pain.  Baseline: see above  Goal status: INITIAL    PLAN: PT FREQUENCY: 1-2x/week   PT DURATION: 8 weeks   PLANNED INTERVENTIONS: Therapeutic exercises, Therapeutic activity, Neuromuscular re-education, Balance training, Gait training, Patient/Family education, Self Care,  Aquatic Therapy, Dry Needling, Electrical stimulation, Cryotherapy, Moist heat, Traction, Manual therapy, and Re-evaluation.   PLAN FOR NEXT SESSION:  trunk mobility, core/postural strengthening    Rosana Hoes, PT, DPT, LAT, ATC 12/06/22  2:26 PM Phone: [phone] Fax: [phone]    PHYSICAL THERAPY DISCHARGE SUMMARY  Visits from Start of Care: 4  Current functional level related to goals / functional outcomes: See above   Remaining deficits: See above   Education / Equipment: HEP   Patient agrees to discharge. Patient goals were not met. Patient is being discharged due to not returning since the last visit.  Rosana Hoes, PT, DPT, LAT, ATC 01/31/23  9:42 AM Phone: [phone] Fax: [phone]

## 2022-12-12 ENCOUNTER — Ambulatory Visit: Payer: Medicaid Other | Admitting: Orthopaedic Surgery

## 2022-12-12 ENCOUNTER — Ambulatory Visit: Payer: Medicaid Other

## 2022-12-18 ENCOUNTER — Ambulatory Visit (INDEPENDENT_AMBULATORY_CARE_PROVIDER_SITE_OTHER): Payer: Medicaid Other | Admitting: Orthopaedic Surgery

## 2022-12-18 ENCOUNTER — Encounter: Payer: Self-pay | Admitting: Orthopaedic Surgery

## 2022-12-18 VITALS — BP 124/82 | Ht 74.0 in | Wt 228.0 lb

## 2022-12-18 DIAGNOSIS — M4722 Other spondylosis with radiculopathy, cervical region: Secondary | ICD-10-CM

## 2022-12-18 DIAGNOSIS — M542 Cervicalgia: Secondary | ICD-10-CM

## 2022-12-18 NOTE — Progress Notes (Unsigned)
   Office Visit Note   Patient: Evan Barker           Date of Birth: [birth year]           MRN: [id number] Visit Date: 12/18/2022              Requested by: Ronita Hipps, MD [address] [place] [address],  PCP: Ronita Hipps, MD   Assessment & Plan: Visit Diagnoses: No diagnosis found.  Plan: ***  Follow-Up Instructions: No follow-ups on file.   Orders:  No orders of the defined types were placed in this encounter.  No orders of the defined types were placed in this encounter.     Procedures: No procedures performed   Clinical Data: No additional findings.   Subjective: Chief Complaint  Patient presents with   Neck - Pain, Follow-up   Middle Back - Pain, Follow-up   Lower Back - Pain, Follow-up    HPI  Review of Systems   Objective: Vital Signs: BP 124/82   Ht 6\' 2"  (1.88 m)   Wt 228 lb (103.4 kg)   BMI 29.27 kg/m   Physical Exam  Ortho Exam  Specialty Comments:  No specialty comments available.  Imaging: No results found.   PMFS History: Patient Active Problem List   Diagnosis Date Noted   Chest pain, rule out acute myocardial infarction 11/10/2018   Past Medical History:  Diagnosis Date   Anxiety    Fracture, skull ([place])    Hernia of abdominal cavity    Shoulder dislocation     Family History  Problem Relation Age of Onset   Heart disease Maternal Uncle    Heart disease Paternal Uncle    Heart murmur Daughter     Past Surgical History:  Procedure Laterality Date   BACK SURGERY     Social History   Occupational History   Not on file  Tobacco Use   Smoking status: Every Day    Packs/day: 1    Types: Cigarettes   Smokeless tobacco: Never  Substance and Sexual Activity   Alcohol use: Not Currently   Drug use: Never   Sexual activity: Not on file

## 2022-12-19 ENCOUNTER — Ambulatory Visit: Payer: Medicaid Other | Admitting: Physical Therapy

## 2022-12-19 DIAGNOSIS — M4722 Other spondylosis with radiculopathy, cervical region: Secondary | ICD-10-CM | POA: Insufficient documentation

## 2022-12-21 ENCOUNTER — Ambulatory Visit: Payer: Medicaid Other | Admitting: Physical Therapy

## 2022-12-25 ENCOUNTER — Telehealth: Payer: Self-pay | Admitting: Orthopaedic Surgery

## 2022-12-25 NOTE — Telephone Encounter (Signed)
Patient requesting x-rays to be sent to PCP for a referral to a cardiologist please call patient

## 2022-12-26 ENCOUNTER — Telehealth: Payer: Self-pay | Admitting: Orthopaedic Surgery

## 2022-12-26 NOTE — Telephone Encounter (Signed)
Received call from patient, he needs all xrays from 11/01/22 on CD. Please call when ready. He will pick up [phone]

## 2022-12-26 NOTE — Telephone Encounter (Signed)
I called from [facility] office, no answer. We do not send x-rays to PCP normally.  Patient can pick up on CD if that is what he would like to do.

## 2022-12-26 NOTE — Telephone Encounter (Signed)
CD is ready for pickup at front desk. Attempted to call pt, voicemail box not set up.

## 2022-12-27 NOTE — Telephone Encounter (Signed)
Patient called office back per chart requesting CD.  CD is available at the front desk for pick up.

## 2022-12-27 NOTE — Telephone Encounter (Signed)
I called patient to advise CD ready at the front. Voicemail is not set up.

## 2023-01-21 ENCOUNTER — Emergency Department (HOSPITAL_COMMUNITY): Payer: Medicaid Other

## 2023-01-21 ENCOUNTER — Emergency Department (HOSPITAL_COMMUNITY)
Admission: EM | Admit: 2023-01-21 | Discharge: 2023-01-21 | Disposition: A | Discharge status: Home / Self Care | Payer: Medicaid Other | Attending: Emergency Medicine | Admitting: Emergency Medicine

## 2023-01-21 ENCOUNTER — Encounter (HOSPITAL_COMMUNITY): Payer: Self-pay

## 2023-01-21 DIAGNOSIS — M5030 Other cervical disc degeneration, unspecified cervical region: Secondary | ICD-10-CM | POA: Diagnosis not present

## 2023-01-21 DIAGNOSIS — R7309 Other abnormal glucose: Secondary | ICD-10-CM | POA: Diagnosis not present

## 2023-01-21 DIAGNOSIS — R1011 Right upper quadrant pain: Secondary | ICD-10-CM | POA: Diagnosis present

## 2023-01-21 DIAGNOSIS — F410 Panic disorder [episodic paroxysmal anxiety] without agoraphobia: Secondary | ICD-10-CM

## 2023-01-21 DIAGNOSIS — K8020 Calculus of gallbladder without cholecystitis without obstruction: Secondary | ICD-10-CM | POA: Diagnosis not present

## 2023-01-21 DIAGNOSIS — K8050 Calculus of bile duct without cholangitis or cholecystitis without obstruction: Secondary | ICD-10-CM

## 2023-01-21 LAB — URINALYSIS, ROUTINE W REFLEX MICROSCOPIC
Bacteria, UA: NONE SEEN
Bilirubin Urine: NEGATIVE
Glucose, UA: NEGATIVE mg/dL
Hgb urine dipstick: NEGATIVE
Ketones, ur: NEGATIVE mg/dL
Nitrite: NEGATIVE
Protein, ur: NEGATIVE mg/dL
Specific Gravity, Urine: 1.023 (ref 1.005–1.030)
pH: 5 (ref 5.0–8.0)

## 2023-01-21 LAB — CBC
HCT: 43.2 % (ref 39.0–52.0)
Hemoglobin: 14.8 g/dL (ref 13.0–17.0)
MCH: 30.3 pg (ref 26.0–34.0)
MCHC: 34.3 g/dL (ref 30.0–36.0)
MCV: 88.5 fL (ref 80.0–100.0)
Platelets: 360 10*3/uL (ref 150–400)
RBC: 4.88 MIL/uL (ref 4.22–5.81)
RDW: 13.5 % (ref 11.5–15.5)
WBC: 10.3 10*3/uL (ref 4.0–10.5)
nRBC: 0 % (ref 0.0–0.2)

## 2023-01-21 LAB — I-STAT CHEM 8, ED
BUN: 14 mg/dL (ref 8–23)
Calcium, Ion: 1.16 mmol/L (ref 1.15–1.40)
Chloride: 104 mmol/L (ref 98–111)
Creatinine, Ser: 1.1 mg/dL (ref 0.61–1.24)
Glucose, Bld: 104 mg/dL — ABNORMAL HIGH (ref 70–99)
HCT: 44 % (ref 39.0–52.0)
Hemoglobin: 15 g/dL (ref 13.0–17.0)
Potassium: 3.7 mmol/L (ref 3.5–5.1)
Sodium: 139 mmol/L (ref 135–145)
TCO2: 27 mmol/L (ref 22–32)

## 2023-01-21 LAB — DIFFERENTIAL
Abs Immature Granulocytes: 0.02 10*3/uL (ref 0.00–0.07)
Basophils Absolute: 0.1 10*3/uL (ref 0.0–0.1)
Basophils Relative: 1 %
Eosinophils Absolute: 0.1 10*3/uL (ref 0.0–0.5)
Eosinophils Relative: 1 %
Immature Granulocytes: 0 %
Lymphocytes Relative: 27 %
Lymphs Abs: 2.8 10*3/uL (ref 0.7–4.0)
Monocytes Absolute: 0.6 10*3/uL (ref 0.1–1.0)
Monocytes Relative: 6 %
Neutro Abs: 6.7 10*3/uL (ref 1.7–7.7)
Neutrophils Relative %: 65 %

## 2023-01-21 LAB — COMPREHENSIVE METABOLIC PANEL
ALT: 35 U/L (ref 0–44)
AST: 37 U/L (ref 15–41)
Albumin: 4 g/dL (ref 3.5–5.0)
Alkaline Phosphatase: 74 U/L (ref 38–126)
Anion gap: 11 (ref 5–15)
BUN: 14 mg/dL (ref 8–23)
CO2: 24 mmol/L (ref 22–32)
Calcium: 9.7 mg/dL (ref 8.9–10.3)
Chloride: 102 mmol/L (ref 98–111)
Creatinine, Ser: 1.16 mg/dL (ref 0.61–1.24)
GFR, Estimated: >60 mL/min (ref >60)
Glucose, Bld: 106 mg/dL — ABNORMAL HIGH (ref 70–99)
Potassium: 3.7 mmol/L (ref 3.5–5.1)
Sodium: 137 mmol/L (ref 135–145)
Total Bilirubin: 0.7 mg/dL (ref 0.3–1.2)
Total Protein: 7.9 g/dL (ref 6.5–8.1)

## 2023-01-21 LAB — RAPID URINE DRUG SCREEN, HOSP PERFORMED
Amphetamines: POSITIVE — AB
Barbiturates: NOT DETECTED
Benzodiazepines: NOT DETECTED
Cocaine: NOT DETECTED
Opiates: NOT DETECTED
Tetrahydrocannabinol: POSITIVE — AB

## 2023-01-21 LAB — APTT: aPTT: 28 seconds (ref 24–36)

## 2023-01-21 LAB — PROTIME-INR
INR: 1 (ref 0.8–1.2)
Prothrombin Time: 13.6 seconds (ref 11.4–15.2)

## 2023-01-21 LAB — ETHANOL: Alcohol, Ethyl (B): <10 mg/dL (ref <10)

## 2023-01-21 LAB — CBG MONITORING, ED: Glucose-Capillary: 100 mg/dL — ABNORMAL HIGH (ref 70–99)

## 2023-01-21 MED ORDER — IOHEXOL 350 MG/ML SOLN
75.0000 mL | Freq: Once | INTRAVENOUS | Status: AC | PRN
  Administered 2023-01-21: 75 mL via INTRAVENOUS

## 2023-01-21 MED ORDER — FENTANYL CITRATE PF 50 MCG/ML IJ SOSY
50.0000 ug | PREFILLED_SYRINGE | Freq: Once | INTRAMUSCULAR | Status: AC
  Administered 2023-01-21: 50 ug via INTRAVENOUS
  Filled 2023-01-21: qty 1

## 2023-01-21 MED ORDER — LORAZEPAM 2 MG/ML IJ SOLN
1.0000 mg | Freq: Once | INTRAMUSCULAR | Status: AC
  Administered 2023-01-21: 1 mg via INTRAVENOUS
  Filled 2023-01-21: qty 1

## 2023-01-21 NOTE — Code Documentation (Signed)
Evan Barker is a 62 yr old male with a PMH of skull fracture and anxiety presenting to [facility] on 01/21/2023 for evaluation of RUQ abdominal pain. He is not on any known thinners. Pt was LKW at 1545, after which time, ED staff noted that he was having aphasia, and Code Stroke was activated.    Pt met with Stroke Team upon return to ED room 1. He had already completed his CT scan. Pt is agitated, and crying. He is having some trouble answering questions.  (NIHSS 1) He has no weakness, or other cortical symptoms. After several deep breaths, pt was able to speak fluently. Per Dr. Derry Lory, CT head is neg for acute abnormality.    Pt will need q 2 hr VS and NIHSS for 12 hours then q 4. He is not eligible for thrombolytics or thrombectomy as low suspicion for stroke. Bedside handoff with ED RN complete.

## 2023-01-21 NOTE — ED Triage Notes (Signed)
Pt c/o RUQ abd pain radiating into back x "several months- a long time." Pt advises he was "supposed to get [place] "but I haven't." Pt also states he is a heart patient, "I think I have something in my neck."Pt tearful in triage, states he keeps "passing out- everywhere & I don't know what's going on-it hurts so bad."

## 2023-01-21 NOTE — ED Notes (Signed)
CALLED TO ACTIVATE A CODE STROKE PER ROBINSON PA

## 2023-01-21 NOTE — Discharge Instructions (Addendum)
Your imaging and laboratory evaluation was overall reassuring.  Recommend you follow-up with general surgery for your right upper quadrant pain.  There was no evidence of stroke or vascular abnormality in your head or neck.  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  Recommend outpatient follow-up with your PCP.

## 2023-01-21 NOTE — ED Provider Triage Note (Signed)
Emergency Medicine Provider Triage Evaluation Note  Evan Barker , a 62 y.o. male  was evaluated in triage.  Pt complains of right upper quadrant pain.  Has been going for several months.  Patient states that he was "supposed to get an ultrasound of his right upper quadrant.  Patient notably tearful and anxious.  Also having transient moments in and out of lucid thought, briefly staring to the open opposing wall during sentence.  He could not recall when he was born.  I held a pen in front of him and asked him what it was and he cannot produce the word.  He was visibly frustrated.  Denies alcohol or any other drug ingestion.  Supposedly was appropriately mentating in the waiting room approximately 20 minutes ago.  Mention that he has history of carotid artery stenosis.  Denies chest pain shortness of breath this time.  Review of Systems  Positive: See above Negative: See above  Physical Exam  BP (!) 127/95 (BP Location: Right Arm)   Pulse (!) 101   Temp 98.4 F (36.9 C) (Oral)   Resp 19   SpO2 100%  Gen:   Awake, no distress   Resp:  Normal effort  MSK:   Moves extremities without difficulty  Other:  Aphasia, gait is steady, strength and sensation symmetric in all extremities  Medical Decision Making  Medically screening exam initiated at 4:10 PM.  Appropriate orders placed.  GREGG WINCHELL was informed that the remainder of the evaluation will be completed by another provider, this initial triage assessment does not replace that evaluation, and the importance of remaining in the ED until their evaluation is complete.  Work up started   Gareth Eagle, [place] 01/21/23 1614

## 2023-01-21 NOTE — ED Notes (Signed)
Pt noted to be altered during triage; EDP notified, to assess pt

## 2023-01-21 NOTE — Consult Note (Signed)
Neurology Consultation  Reason for Consult: Code stroke Referring Physician: Dr. Karene Fry  CC: RUQ pain, "I am so scared"  History is obtained from: Patient, chart review  HPI: Evan Barker is a 62 y.o. male with a medical history significant for anxiety who presented to the ED 01/21/2023 with complaints of right upper quadrant abdominal pain radiating into his back for several months.  Patient notes that he was supposed to get an abdominal ultrasound at some point and recent history but has not yet undergone imaging.  Patient reported to triage staff that he has had multiple episodes of "passing out" as well.  While in triage, patient appeared to be altered and was felt to have impaired communication and a code stroke was activated at that time.  LKW: Unclear, patient states he does not remember coming to the hospital, patient is a poor historian  TNK given?: no, examination of patient is without focal neurologic deficits and acute stroke is not suspected at the time of neurology assessment.  IR Thrombectomy? No, presentation is not consistent with an LVO Modified Rankin Scale: 0-Completely asymptomatic and back to baseline post- stroke  ROS: Unable to obtain full review of systems due to obtain due to limited patient cooperation and significant anxiety with questioning.  Past Medical History:  Diagnosis Date   Anxiety    Fracture, skull    Hernia of abdominal cavity    Shoulder dislocation    Past Surgical History:  Procedure Laterality Date   BACK SURGERY     Family History  Problem Relation Age of Onset   Heart disease Maternal Uncle    Heart disease Paternal Uncle    Heart murmur Daughter    Social History:   reports that he has been smoking cigarettes. He has been smoking an average of 1 pack per day. He has never used smokeless tobacco. He reports that he does not currently use alcohol. He reports that he does not use drugs.  Medications No current facility-administered  medications for this encounter.  Current Outpatient Medications:    acetaminophen (TYLENOL) 500 MG tablet, Take 500-1,000 mg by mouth every 6 (six) hours as needed for mild pain, moderate pain, fever or headache., Disp: , Rfl:    atorvastatin (LIPITOR) 40 MG tablet, Take 1 tablet (40 mg total) by mouth daily., Disp: 30 tablet, Rfl: 0   gabapentin (NEURONTIN) 300 MG capsule, Take 300 mg by mouth 3 (three) times daily., Disp: , Rfl:    ibuprofen (ADVIL,MOTRIN) 200 MG tablet, Take 200-600 mg by mouth every 6 (six) hours as needed for fever, headache, mild pain, moderate pain or cramping., Disp: , Rfl:    meloxicam (MOBIC) 15 MG tablet, Take 15 mg by mouth daily., Disp: , Rfl:    omeprazole (PRILOSEC) 20 MG capsule, Take 20 mg by mouth at bedtime., Disp: , Rfl:    sertraline (ZOLOFT) 25 MG tablet, Take 1 tablet (25 mg total) by mouth at bedtime., Disp: 30 tablet, Rfl: 0  Exam: Current vital signs: BP (!) 127/95 (BP Location: Right Arm)   Pulse (!) 101   Temp 98.4 F (36.9 C) (Oral)   Resp 19   SpO2 100%  Vital signs in last 24 hours: Temp:  [98.4 F (36.9 C)] 98.4 F (36.9 C) (04/22 1542) Pulse Rate:  [101] 101 (04/22 1542) Resp:  [19] 19 (04/22 1542) BP: (127)/(95) 127/95 (04/22 1542) SpO2:  [100 %] 100 % (04/22 1542)  GENERAL: Awake, alert, appears anxious Psych: Patient is significantly  anxious, paranoid, and tearful throughout exam.  Patient is untrusting of staff and requires frequent redirection Head: Normocephalic and atraumatic, without obvious abnormality EENT: Normal conjunctivae, dry mucous membranes, no OP obstruction, poor dentition LUNGS: Normal respiratory effort. Non-labored breathing on room air CV: Regular rate and rhythm on telemetry ABDOMEN: Non-distended, patient guards his right upper quadrant with his hand and does not allow for examiner to palpate or visualize abdomen Extremities: warm, well perfused, without obvious deformity  NEURO:  Mental Status: Awake,  alert, and oriented to self.  He states incorrectly that he is 62 years old.  He does not answer when asked the current month.  Patient states "how did I get here" and is unable to state events leading to ER visit. Patient is significantly anxious throughout exam and tearful requiring frequent redirection. Patient is able to communicate his needs and fears with fluent speech.  He does have some hesitancy with speech that increases with increased anxiety.  There is no evidence of aphasia, dysarthria, or neglect. Patient expresses concern that he does not "want people to keep laughing at him" Cranial Nerves:  II: Visual fields are full III, IV, VI: Patient tracks examiner throughout room V: Sensation is intact to light touch and symmetrical to face.  VII: Face is symmetric resting and smiling.  VIII: Hearing intact to voice IX, X: Phonation normal.  XI: Normal sternocleidomastoid and trapezius muscle strength XII: Tongue protrudes midline without fasciculations.   Motor: All extremities move spontaneously and antigravity without vertical drift  Tone is normal. Bulk is normal.  Sensation: Reacts to light touch in all extremities Coordination: Does not perform  Gait: Unsteady and staggering   NIHSS: 1a Level of Conscious.: 0 1b LOC Questions: 1 1c LOC Commands: 0 2 Best Gaze: 0 3 Visual: 0 4 Facial Palsy: 0 5a Motor Arm - left: 0 5b Motor Arm - Right: 0 6a Motor Leg - Left: 0 6b Motor Leg - Right: 0 7 Limb Ataxia: 0 8 Sensory: 0 9 Best Language: 0 10 Dysarthria: 0 11 Extinct. and Inatten.: 0 TOTAL: 1  Labs I have reviewed labs in epic and the results pertinent to this consultation are: CBC    Component Value Date/Time   WBC 10.3 01/21/2023 1612   RBC 4.88 01/21/2023 1612   HGB 15.0 01/21/2023 1617   HCT 44.0 01/21/2023 1617   PLT 360 01/21/2023 1612   MCV 88.5 01/21/2023 1612   MCH 30.3 01/21/2023 1612   MCHC 34.3 01/21/2023 1612   RDW 13.5 01/21/2023 1612   LYMPHSABS  2.8 01/21/2023 1612   MONOABS 0.6 01/21/2023 1612   EOSABS 0.1 01/21/2023 1612   BASOSABS 0.1 01/21/2023 1612   CMP     Component Value Date/Time   NA 139 01/21/2023 1617   K 3.7 01/21/2023 1617   CL 104 01/21/2023 1617   CO2 24 01/21/2023 1612   GLUCOSE 104 (H) 01/21/2023 1617   BUN 14 01/21/2023 1617   CREATININE 1.10 01/21/2023 1617   CALCIUM 9.7 01/21/2023 1612   PROT 7.9 01/21/2023 1612   ALBUMIN 4.0 01/21/2023 1612   AST 37 01/21/2023 1612   ALT 35 01/21/2023 1612   ALKPHOS 74 01/21/2023 1612   BILITOT 0.7 01/21/2023 1612   GFRNONAA >60 01/21/2023 1612   GFRAA >60 11/10/2018 1826   Lipid Panel     Component Value Date/Time   CHOL 228 (H) 11/10/2018 1909   TRIG 365 (H) 11/10/2018 1909   HDL 34 (L) 11/10/2018 1909  CHOLHDL 6.7 11/10/2018 1909   VLDL 73 (H) 11/10/2018 1909   LDLCALC 121 (H) 11/10/2018 1909   Lab Results  Component Value Date   HGBA1C 5.8 (H) 11/10/2018   Imaging I have reviewed the images obtained:  CT-scan of the brain 01/21/23: No acute intracranial hemorrhage or evidence of evolving large vessel territory infarct. ASPECT score is 10.  Assessment: 62 year old male with medical history significant for anxiety who presented to the ED with complaints of right upper quadrant abdominal pain reported for a few months.  In triage the patient complained of "passing out" and became significantly anxious and was felt to be altered with speech disturbance when he was unable to name objects for examiner.  A code stroke was activated.  On neurology evaluation, patient was significantly anxious, tearful, fearful, and paranoid.  When his mood was elevated, patient had increased hesitancy of speech but his neurologic exam was nonfocal.  Presentation is felt to be related to significant anxiety.  Low suspicion for neurologic etiology of patient's presentation at this time.  Recommendations: - Ativan for staff and patient safety  - Without focal neurologic  deficit, will defer further inpatient neurologic deficit at this time - Discussed with Dr. Karene Fry   Pt seen by NP/Neuro and later by MD. Note/plan to be edited by MD as needed.  Lanae Boast, AGAC-NP [facility] Pager: [phone]   NEUROHOSPITALIST ADDENDUM Performed a face to face diagnostic evaluation.   I have reviewed the contents of history and physical exam as documented by PA/ARNP/Resident and agree with above documentation.  I have discussed and formulated the above plan as documented. Edits to the note have been made as needed.  Impression/Key exam findings/Plan: suspect that paucity and hesitant speech is likely due to panic attack rather than true aphasia. He is tearful, anxious, Low overall suspicion for stroke. He is able to talk fluently in between episode of crying. No focal deficit.  CT head is negative. I would hold off on MRI brain and treat this as a panic attack. If this does not improved despite treatment with ativan, then we can consider getting an MRI brain. If he is improved with treatment of panic attack, I dont think we should get an MRI.  Erick Blinks, MD [facility] [phone]   If 7pm to 7am, please call on call as listed on AMION.

## 2023-01-21 NOTE — ED Provider Notes (Signed)
[place] [facility] AT [facility] Provider Note   CSN: [id number] Arrival date & time: 01/21/23  1521     History  Chief Complaint  Patient presents with   Abdominal Pain   Code Stroke    Evan Barker is a 61 y.o. male.   Abdominal Pain    62 year old male with medical history significant for anxiety who presents to the emergency department with concern for right upper quadrant pain that is chronic.  While in triage, the patient had transient moments where he had difficulty recalling objects and verbalizing words and a code stroke was called.  Following code stroke imaging after his airway was cleared, the patient was then cleared by neurology and evaluated in his exam room.  He states that he has had chronic pain in his right upper quadrant that radiates to his back.  Some associated nausea.  Symptoms have been ongoing for the past several months but he has not yet followed up outpatient for this.  He states that he was supposed to get an ultrasound outpatient but has not done this yet.  He denies any facial droop, numbness, weakness.  He endorses significant anxiety regarding his symptoms.  He thinks he had a panic attack earlier.  No chest pain or shortness of breath.  Home Medications Prior to Admission medications   Medication Sig Start Date End Date Taking? Authorizing Provider  acetaminophen (TYLENOL) 500 MG tablet Take 500-1,000 mg by mouth every 6 (six) hours as needed for mild pain, moderate pain, fever or headache.    [provider]  atorvastatin (LIPITOR) 40 MG tablet Take 1 tablet (40 mg total) by mouth daily. 11/11/18   Ali Lowe, MD  gabapentin (NEURONTIN) 300 MG capsule Take 300 mg by mouth 3 (three) times daily. 10/22/22   [provider]  ibuprofen (ADVIL,MOTRIN) 200 MG tablet Take 200-600 mg by mouth every 6 (six) hours as needed for fever, headache, mild pain, moderate pain or cramping.    [provider]   meloxicam (MOBIC) 15 MG tablet Take 15 mg by mouth daily. 10/22/22   [provider]  omeprazole (PRILOSEC) 20 MG capsule Take 20 mg by mouth at bedtime.    [provider]  sertraline (ZOLOFT) 25 MG tablet Take 1 tablet (25 mg total) by mouth at bedtime. 11/11/18   Ali Lowe, MD      Allergies    Aspirin and Penicillins    Review of Systems   Review of Systems  Gastrointestinal:  Positive for abdominal pain.    Physical Exam Updated Vital Signs BP (!) 142/92   Pulse 71   Temp 98 F (36.7 C)   Resp 16   SpO2 99%  Physical Exam Vitals and nursing note reviewed.  Constitutional:      General: He is not in acute distress.    Appearance: He is well-developed.     Comments: Tearful and anxious  HENT:     Head: Normocephalic and atraumatic.  Eyes:     Conjunctiva/sclera: Conjunctivae normal.  Cardiovascular:     Rate and Rhythm: Normal rate and regular rhythm.     Heart sounds: No murmur heard. Pulmonary:     Effort: Pulmonary effort is normal. No respiratory distress.     Breath sounds: Normal breath sounds.  Abdominal:     Palpations: Abdomen is soft.     Tenderness: There is abdominal tenderness in the right upper quadrant. There is no guarding or rebound.  Negative signs include Murphy's sign.  Musculoskeletal:        General: No swelling.     Cervical back: Neck supple.  Skin:    General: Skin is warm and dry.     Capillary Refill: Capillary refill takes less than 2 seconds.  Neurological:     Mental Status: He is alert.     Comments: MENTAL STATUS EXAM:    Orientation: Alert and oriented to person, place and time.  Memory: Cooperative, follows commands well.  Language: Speech is clear and language is normal.   CRANIAL NERVES:    CN 2 (Optic): Visual fields intact to confrontation.  CN 3,4,6 (EOM): Pupils equal and reactive to light. Full extraocular eye movement without nystagmus.  CN 5 (Trigeminal): Facial sensation is normal, no weakness  of masticatory muscles.  CN 7 (Facial): No facial weakness or asymmetry.  CN 8 (Auditory): Auditory acuity grossly normal.  CN 9,10 (Glossophar): The uvula is midline, the palate elevates symmetrically.  CN 11 (spinal access): Normal sternocleidomastoid and trapezius strength.  CN 12 (Hypoglossal): The tongue is midline. No atrophy or fasciculations.Marland Kitchen   MOTOR:  Muscle Strength: 5/5RUE, 5/5LUE, 5/5RLE, 5/5LLE.   COORDINATION:   Intact finger-to-nose, no tremor, no pronator drift.   SENSATION:   Intact to light touch all four extremities.    Psychiatric:        Mood and Affect: Mood normal.     ED Results / Procedures / Treatments   Labs (all labs ordered are listed, but only abnormal results are displayed) Labs Reviewed  COMPREHENSIVE METABOLIC PANEL - Abnormal; Notable for the following components:      Result Value   Glucose, Bld 106 (*)    All other components within normal limits  I-STAT CHEM 8, ED - Abnormal; Notable for the following components:   Glucose, Bld 104 (*)    All other components within normal limits  CBG MONITORING, ED - Abnormal; Notable for the following components:   Glucose-Capillary 100 (*)    All other components within normal limits  ETHANOL  PROTIME-INR  APTT  CBC  DIFFERENTIAL  RAPID URINE DRUG SCREEN, HOSP PERFORMED  URINALYSIS, ROUTINE W REFLEX MICROSCOPIC    EKG EKG Interpretation  Date/Time:  Monday January 21 2023 15:47:57 EDT Ventricular Rate:  100 PR Interval:  140 QRS Duration: 126 QT Interval:  380 QTC Calculation: 490 R Axis:   -41 Text Interpretation: Normal sinus rhythm Left axis deviation Right bundle branch block Abnormal ECG When compared with ECG of 10-Nov-2018 15:04, PREVIOUS ECG IS PRESENT Confirmed by Ernie Avena ([id number]) on 01/21/2023 4:10:39 PM  Radiology MR BRAIN WO CONTRAST  Result Date: 01/21/2023 CLINICAL DATA:  Initial evaluation for neuro deficit, stroke. EXAM: MRI HEAD WITHOUT CONTRAST TECHNIQUE: Multiplanar,  multiecho pulse sequences of the brain and surrounding structures were obtained without intravenous contrast. COMPARISON:  Comparison made with CTs from earlier the same day. FINDINGS: Brain: Cerebral volume within normal limits. Patchy T2/FLAIR hyperintensity involving periventricular, deep, and subcortical white matter both cerebral hemispheres, nonspecific, but most commonly related to chronic microvascular ischemic disease. Changes are moderate in nature. No evidence for acute or subacute ischemia. Gray-white matter differentiation maintained. No erosive chronic cortical infarction. No acute or chronic intracranial blood products. No mass lesion, mass effect or midline shift. No hydrocephalus or extra-axial fluid collection. Pituitary gland and suprasellar region within normal limits. Vascular: Major intracranial vascular flow voids are maintained. Skull and upper cervical spine: Craniocervical junction within normal limits. Bone marrow signal  intensity normal. No scalp soft tissue abnormality. Sinuses/Orbits: Globes and orbital soft tissues within normal limits. Chronic frontal, ethmoidal, and left maxillary sinusitis noted. No mastoid effusion. Other: None. IMPRESSION: 1. No acute intracranial abnormality. 2. Moderate cerebral white matter disease, nonspecific, but most commonly related to chronic microvascular ischemic disease. 3. Chronic frontal, ethmoidal, and left maxillary sinusitis. Electronically Signed   By: Rise Mu M.D.   On: 01/21/2023 21:38   CT ANGIO HEAD NECK W WO CM  Result Date: 01/21/2023 CLINICAL DATA:  Stroke EXAM: CT ANGIOGRAPHY HEAD AND NECK WITH AND WITHOUT CONTRAST TECHNIQUE: Multidetector CT imaging of the head and neck was performed using the standard protocol during bolus administration of intravenous contrast. Multiplanar CT image reconstructions and MIPs were obtained to evaluate the vascular anatomy. Carotid stenosis measurements (when applicable) are obtained  utilizing NASCET criteria, using the distal internal carotid diameter as the denominator. RADIATION DOSE REDUCTION: This exam was performed according to the departmental dose-optimization program which includes automated exposure control, adjustment of the mA and/or kV according to patient size and/or use of iterative reconstruction technique. CONTRAST:  75mL OMNIPAQUE IOHEXOL 350 MG/ML SOLN COMPARISON:  Same day CT head FINDINGS: CT HEAD FINDINGS See same day CT head for intracranial findings. CTA NECK FINDINGS Aortic arch: Standard branching. Imaged portion shows no evidence of aneurysm or dissection. No significant stenosis of the major arch vessel origins. Right carotid system: No evidence of dissection, stenosis (50% or greater), or occlusion. Mild narrowing of the origin of the right ICA secondary to soft atherosclerotic plaque. Left carotid system: No evidence of dissection, stenosis (50% or greater), or occlusion. Mild narrowing of the origin of the left ICA secondary to calcified atherosclerotic plaque Vertebral arteries: Codominant. No evidence of dissection, stenosis (50% or greater), or occlusion. Skeleton: There is central disc protrusion at C3-C4 that results in mild spinal canal narrowing. Other neck: Carious dentition with multiple periapical lucencies. Upper chest: Negative. Review of the MIP images confirms the above findings CTA HEAD FINDINGS Anterior circulation: No significant stenosis, proximal occlusion, aneurysm, or vascular malformation. Posterior circulation: No significant stenosis, proximal occlusion, aneurysm, or vascular malformation. Venous sinuses: As permitted by contrast timing, patent. Anatomic variants: Fetal PCA on the left Review of the MIP images confirms the above findings IMPRESSION: 1. No intracranial large vessel occlusion or significant stenosis. 2. No hemodynamically significant stenosis in the neck. 3. Central disc protrusion at C3-C4 that results in mild spinal canal  narrowing. Electronically Signed   By: Lorenza Cambridge M.D.   On: 01/21/2023 20:27   US Abdomen Limited RUQ (LIVER/GB)  Result Date: 01/21/2023 CLINICAL DATA:  Right upper quadrant abdominal pain. EXAM: ULTRASOUND ABDOMEN LIMITED RIGHT UPPER QUADRANT COMPARISON:  April 18, 2018. FINDINGS: Gallbladder: No gallstones or wall thickening visualized. No sonographic Murphy sign noted by sonographer. Mild amount of sludge is noted within the gallbladder lumen. Common bile duct: Diameter: 4 mm which is within normal limits. Liver: No focal lesion identified. Increased echogenicity of hepatic parenchyma is noted suggesting hepatic steatosis. Portal vein is patent on color Doppler imaging with normal direction of blood flow towards the liver. Other: None. IMPRESSION: Hepatic steatosis. Mild amount of sludge seen within gallbladder lumen. Electronically Signed   By: Lupita Raider M.D.   On: 01/21/2023 17:48   CT HEAD CODE STROKE WO CONTRAST  Result Date: 01/21/2023 CLINICAL DATA:  Code stroke. Neuro deficit, acute, stroke suspected. EXAM: CT HEAD WITHOUT CONTRAST TECHNIQUE: Contiguous axial images were obtained from the base of  the skull through the vertex without intravenous contrast. RADIATION DOSE REDUCTION: This exam was performed according to the departmental dose-optimization program which includes automated exposure control, adjustment of the mA and/or kV according to patient size and/or use of iterative reconstruction technique. COMPARISON:  Head CT 11/10/2018. FINDINGS: Brain: No acute hemorrhage. Unchanged chronic small-vessel disease. Cortical gray-white differentiation is otherwise preserved. Prominence of the ventricles and sulci within expected range for age. No hydrocephalus or extra-axial collection. No mass effect or midline shift. Vascular: No hyperdense vessel or unexpected calcification. Skull: No calvarial fracture or suspicious bone lesion. Skull base is unremarkable. Sinuses/Orbits: Left middle  meatus pattern of sinonasal disease. Orbits are unremarkable. Other: None. ASPECTS ([place] Stroke Program Early CT Score) - Ganglionic level infarction (caudate, lentiform nuclei, internal capsule, insula, M1-M3 cortex): 7 - Supraganglionic infarction (M4-M6 cortex): 3 Total score (0-10 with 10 being normal): 10 IMPRESSION: No acute intracranial hemorrhage or evidence of evolving large vessel territory infarct. ASPECT score is 10. Code stroke imaging results were communicated on 01/21/2023 at 4:31 pm to provider Dr. Derry Lory via secure text paging. Electronically Signed   By: Orvan Falconer M.D.   On: 01/21/2023 16:33    Procedures Procedures    Medications Ordered in ED Medications  LORazepam (ATIVAN) injection 1 mg (1 mg Intravenous Given 01/21/23 1658)  fentaNYL (SUBLIMAZE) injection 50 mcg (50 mcg Intravenous Given 01/21/23 2018)  iohexol (OMNIPAQUE) 350 MG/ML injection 75 mL (75 mLs Intravenous Contrast Given 01/21/23 2003)    ED Course/ Medical Decision Making/ A&P                             Medical Decision Making Amount and/or Complexity of Data Reviewed Radiology: ordered.  Risk Prescription drug management.    62 year old male with medical history significant for anxiety who presents to the emergency department with concern for right upper quadrant pain that is chronic.  While in triage, the patient had transient moments where he had difficulty recalling objects and verbalizing words and a code stroke was called.  Following code stroke imaging after his airway was cleared, the patient was then cleared by neurology and evaluated in his exam room.  He states that he has had chronic pain in his right upper quadrant that radiates to his back.  Some associated nausea.  Symptoms have been ongoing for the past several months but he has not yet followed up outpatient for this.  He states that he was supposed to get an ultrasound outpatient but has not done this yet.  He denies any facial  droop, numbness, weakness.  He endorses significant anxiety regarding his symptoms.  He thinks he had a panic attack earlier.  No chest pain or shortness of breath.  On arrival, the patient was afebrile, mildly tachycardic heart rate 101, not tachypneic RR 19, BP 127/95, saturating 100% on room air.  Physical exam significant for an anxious appearing male, no neurologic deficits, right upper quadrant tenderness with no rebound or guarding, negative Murphy sign.  Differential diagnosis includes CVA, anxiety/panic attack, TIA, carotid stenosis, cholelithiasis/cholecystitis, peptic ulcer disease, less likely small bowel obstruction, pancreatitis.  Initial CT imaging of the head was obtained which was unremarkable: IMPRESSION:  No acute intracranial hemorrhage or evidence of evolving large  vessel territory infarct. ASPECT score is 10.    Code stroke imaging results were communicated on 01/21/2023 at 4:31  pm to provider Dr. Derry Lory via secure text paging.    Laboratory  evaluation significant for CBG 100, CBC without a leukocytosis or anemia, CMP unremarkable, ethanol level normal.  The patient was administered IV Ativan for anxiousness.  Following this, his symptoms significantly improved.  He was administered IV fentanyl for pain control.  Right upper quadrant ultrasound was performed which revealed biliary sludge, no evidence of cholecystitis.  IMPRESSION:  Hepatic steatosis. Mild amount of sludge seen within gallbladder  lumen.    Labs also without evidence of significant biliary dysfunction, normal LFTs as well.  Abdomen mildly tender on exam.  Dr. Tempie Hoist of neurology recommended CTA Head Neck and MRI Brain given the patient's hx of carotid occlusions.  Dr. Janee Morn of general surgery recommended outpatient follow-up.   CTA Head and Neck: IMPRESSION:  1. No intracranial large vessel occlusion or significant stenosis.  2. No hemodynamically significant stenosis in the neck.  3.  Central disc protrusion at C3-C4 that results in mild spinal  canal narrowing.    MRI Brain WO: IMPRESSION:  1. No acute intracranial abnormality.  2. Moderate cerebral white matter disease, nonspecific, but most  commonly related to chronic microvascular ischemic disease.  3. Chronic frontal, ethmoidal, and left maxillary sinusitis.   Evidence of vascular abnormality, no evidence of stroke.  Patient abdominal pain could be due to intermittent biliary colic.  Advised to the patient follow-up outpatient with general surgery, referral was placed.  Patient has disc protrusion at C3-C4 with mild canal narrowing but no red flag symptoms to suggest cauda equina.  Advised outpatient PCP follow-up.  Stable for discharge.    Final Clinical Impression(s) / ED Diagnoses Final diagnoses:  Biliary colic  Panic attack  DDD (degenerative disc disease), cervical    Rx / DC Orders ED Discharge Orders          Ordered    Ambulatory referral to General Surgery        01/21/23 2228              Ernie Avena, MD 01/21/23 2229
